# Patient Record
Sex: Female | Born: 1945 | Race: White | Hispanic: No | Marital: Married | Smoking: Never smoker
Health system: Southern US, Community
[De-identification: ages and names within clinical notes are randomized; demographics above are authoritative.]

## PROBLEM LIST (undated history)

## (undated) DIAGNOSIS — H348112 Central retinal vein occlusion, right eye, stable: Principal | ICD-10-CM

## (undated) HISTORY — DX: Central retinal vein occlusion, right eye, stable: H34.8112

---

## 2003-05-02 ENCOUNTER — Other Ambulatory Visit: Admission: RE | Admit: 2003-05-02 | Discharge: 2003-05-02 | Payer: Self-pay | Admitting: *Deleted

## 2010-05-28 ENCOUNTER — Emergency Department (HOSPITAL_COMMUNITY)
Admission: EM | Admit: 2010-05-28 | Discharge: 2010-05-28 | Disposition: A | Discharge status: Home / Self Care | Payer: Commercial Indemnity | Attending: Emergency Medicine | Admitting: Emergency Medicine

## 2010-05-28 ENCOUNTER — Emergency Department (HOSPITAL_COMMUNITY): Payer: Commercial Indemnity

## 2010-05-28 ENCOUNTER — Encounter (HOSPITAL_COMMUNITY): Payer: Self-pay

## 2010-05-28 DIAGNOSIS — W010XXA Fall on same level from slipping, tripping and stumbling without subsequent striking against object, initial encounter: Secondary | ICD-10-CM | POA: Insufficient documentation

## 2010-05-28 DIAGNOSIS — R51 Headache: Secondary | ICD-10-CM | POA: Insufficient documentation

## 2010-05-28 DIAGNOSIS — S0003XA Contusion of scalp, initial encounter: Secondary | ICD-10-CM | POA: Insufficient documentation

## 2011-06-03 ENCOUNTER — Ambulatory Visit (INDEPENDENT_AMBULATORY_CARE_PROVIDER_SITE_OTHER): Payer: BC Managed Care – PPO | Admitting: Obstetrics and Gynecology

## 2011-06-03 DIAGNOSIS — Z01419 Encounter for gynecological examination (general) (routine) without abnormal findings: Secondary | ICD-10-CM

## 2011-10-01 ENCOUNTER — Other Ambulatory Visit: Payer: Self-pay

## 2011-10-01 MED ORDER — ESTRADIOL ACETATE 0.05 MG/24HR VA RING: 1.0000 | VAGINAL_RING | VAGINAL | Status: DC

## 2011-10-01 NOTE — Telephone Encounter (Signed)
rx done ld

## 2011-10-24 IMAGING — CT CT HEAD W/O CM
2 series · 16 of 30 positions shown, 18 images · non-contrast
Comparison: None.

CLINICAL DATA: Trauma.  Fall.  Struck back of head.

CT HEAD WITHOUT CONTRAST
TECHNIQUE: Contiguous axial images were obtained from the base of
the skull through the vertex without contrast.

[Series 3: head w/o · axial · non-contrast · 0.55mm/px · z∈[+406,+526]mm · 8 of 32 slices shown, 10 images]
[im 4/32  brain]
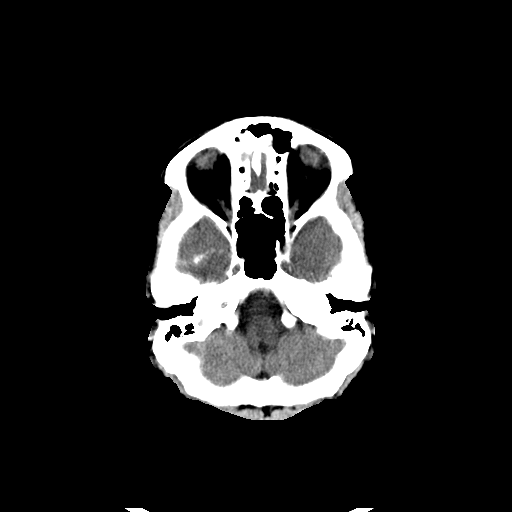
[im 4/32  bone]
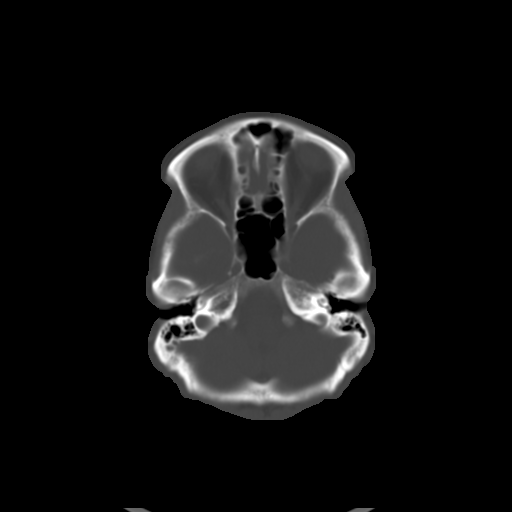
[im 7/32  brain]
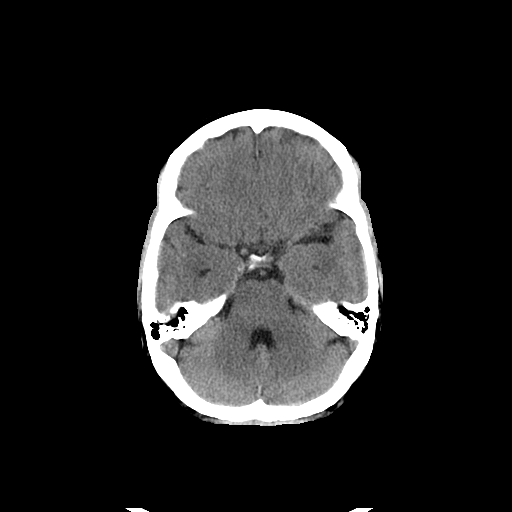
[im 11/32  brain]
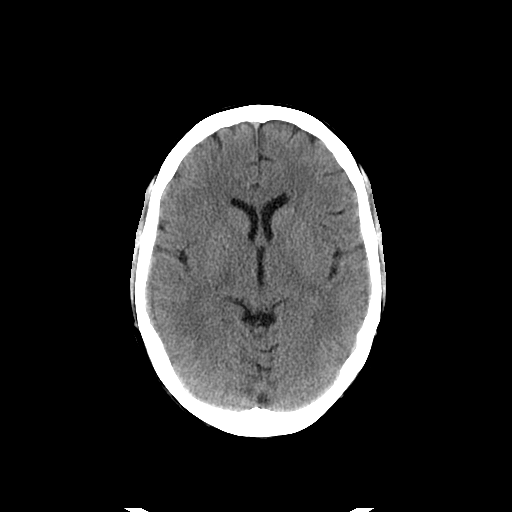
[im 14/32  brain]
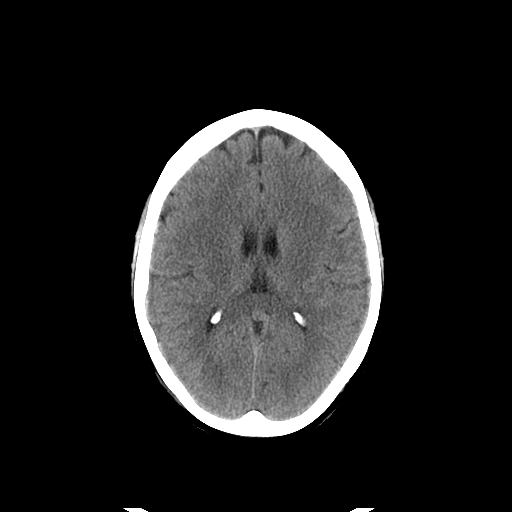
[im 18/32  brain]
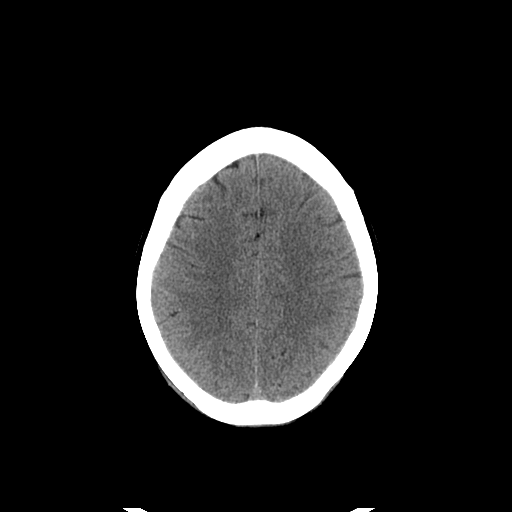
[im 18/32  bone]
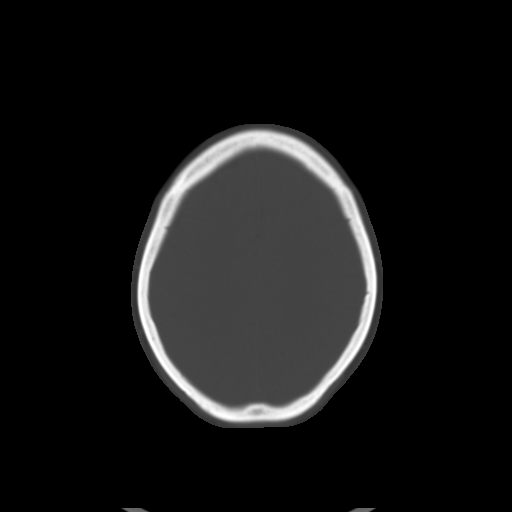
[im 21/32  brain]
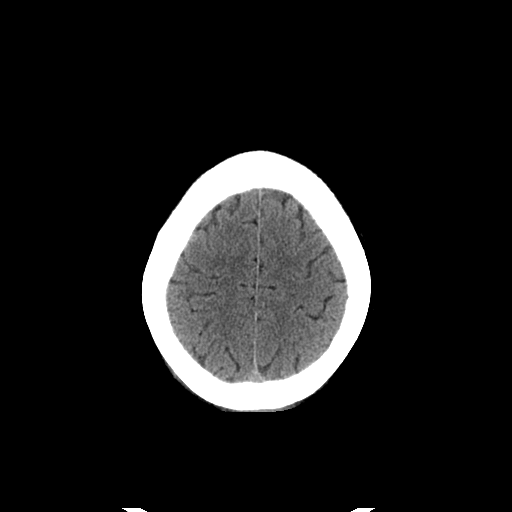
[im 25/32  brain]
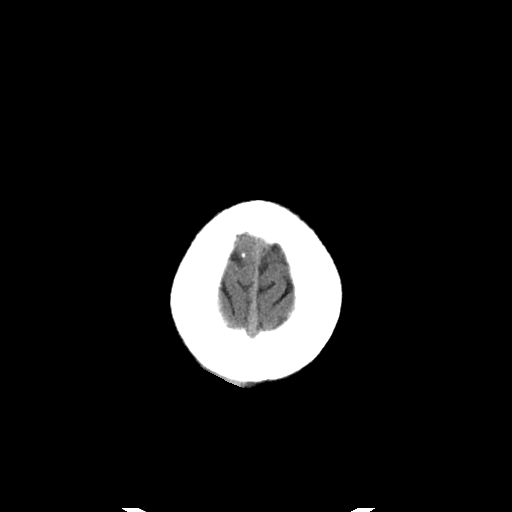
[im 28/32  brain]
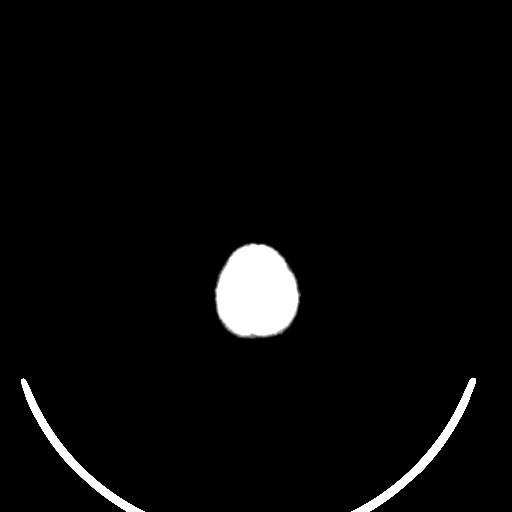

[Series 4: head w/o bone · axial · non-contrast · 0.55mm/px · z∈[+406,+529]mm · 8 of 63 slices shown]
[im 7/63  bone]
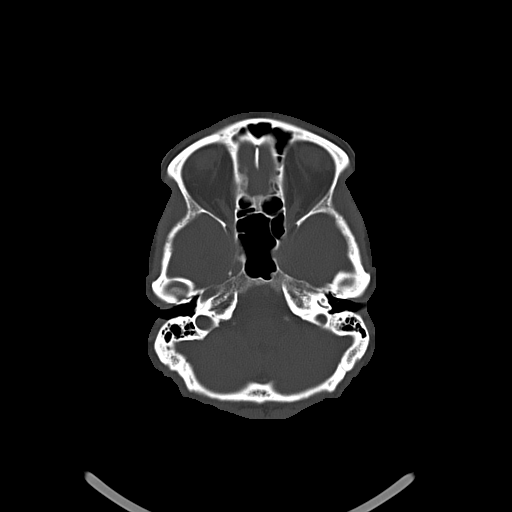
[im 14/63  bone]
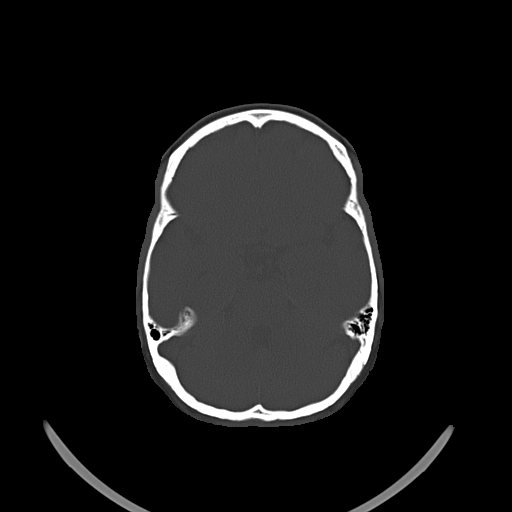
[im 20/63  bone]
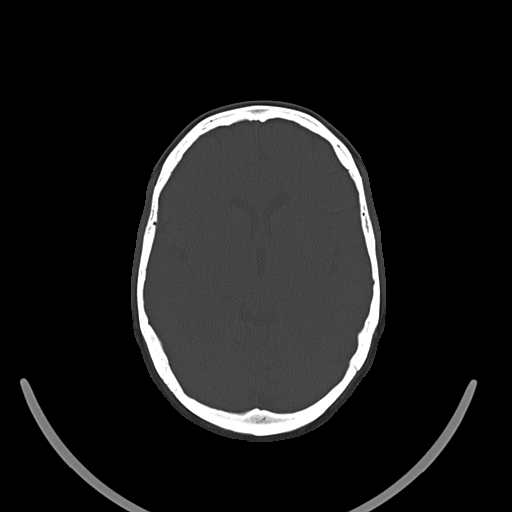
[im 27/63  bone]
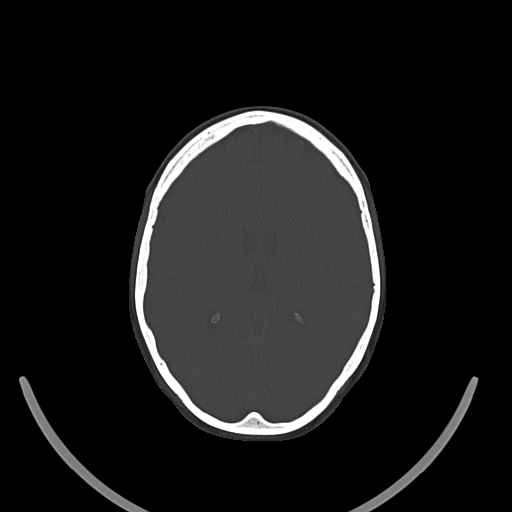
[im 36/63  bone]
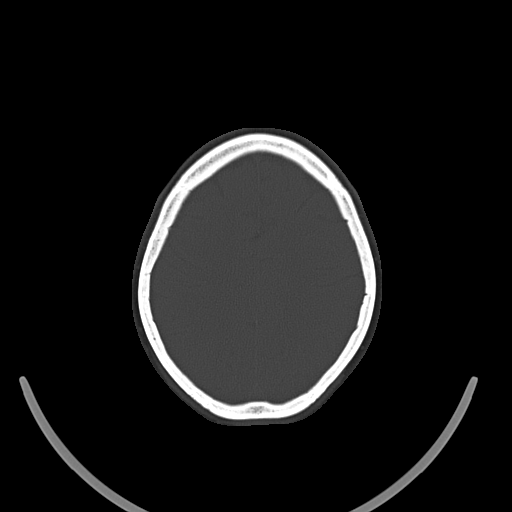
[im 43/63  bone]
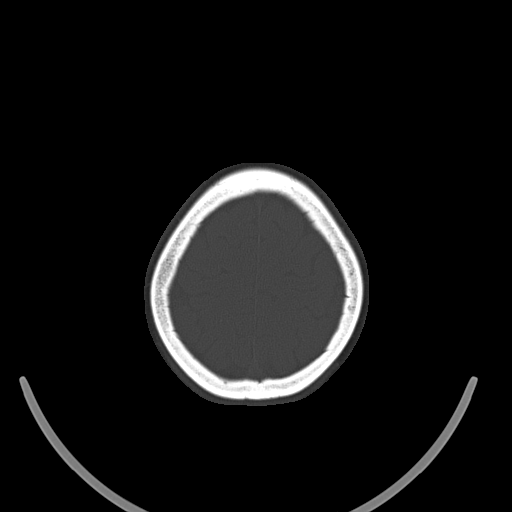
[im 49/63  bone]
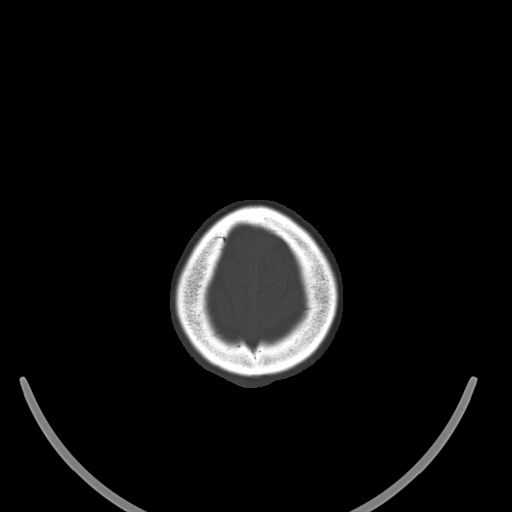
[im 56/63  bone]
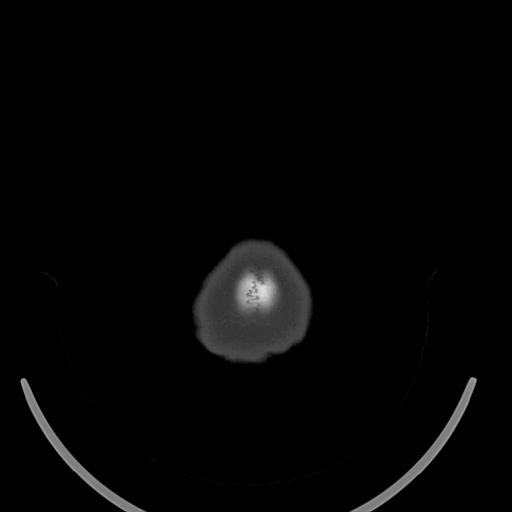

[16 of 30 positions shown; findings below may reference images not displayed]

FINDINGS: No mass lesion, mass effect, midline shift,
hydrocephalus, hemorrhage.  No territorial ischemia or acute
infarction.  Right parietal scalp hematoma is present.  No skull
fracture.  Paranasal sinuses normal.
IMPRESSION: Negative CT brain.  Small right parietal scalp hematoma.

## 2013-12-01 ENCOUNTER — Encounter (INDEPENDENT_AMBULATORY_CARE_PROVIDER_SITE_OTHER): Payer: Medicare HMO | Admitting: Ophthalmology

## 2013-12-01 DIAGNOSIS — H2510 Age-related nuclear cataract, unspecified eye: Secondary | ICD-10-CM

## 2013-12-01 DIAGNOSIS — H35039 Hypertensive retinopathy, unspecified eye: Secondary | ICD-10-CM

## 2013-12-01 DIAGNOSIS — H43819 Vitreous degeneration, unspecified eye: Secondary | ICD-10-CM

## 2013-12-01 DIAGNOSIS — H348392 Tributary (branch) retinal vein occlusion, unspecified eye, stable: Secondary | ICD-10-CM

## 2013-12-01 DIAGNOSIS — I10 Essential (primary) hypertension: Secondary | ICD-10-CM

## 2013-12-28 ENCOUNTER — Encounter (INDEPENDENT_AMBULATORY_CARE_PROVIDER_SITE_OTHER): Payer: Medicare HMO | Admitting: Ophthalmology

## 2013-12-28 DIAGNOSIS — H43819 Vitreous degeneration, unspecified eye: Secondary | ICD-10-CM

## 2013-12-28 DIAGNOSIS — H2510 Age-related nuclear cataract, unspecified eye: Secondary | ICD-10-CM

## 2013-12-28 DIAGNOSIS — H348392 Tributary (branch) retinal vein occlusion, unspecified eye, stable: Secondary | ICD-10-CM

## 2014-01-25 ENCOUNTER — Encounter (INDEPENDENT_AMBULATORY_CARE_PROVIDER_SITE_OTHER): Payer: Medicare HMO | Admitting: Ophthalmology

## 2014-01-25 DIAGNOSIS — H43813 Vitreous degeneration, bilateral: Secondary | ICD-10-CM

## 2014-01-25 DIAGNOSIS — H34831 Tributary (branch) retinal vein occlusion, right eye: Secondary | ICD-10-CM

## 2014-01-25 DIAGNOSIS — H2513 Age-related nuclear cataract, bilateral: Secondary | ICD-10-CM

## 2014-03-08 ENCOUNTER — Encounter (INDEPENDENT_AMBULATORY_CARE_PROVIDER_SITE_OTHER): Payer: Medicare HMO | Admitting: Ophthalmology

## 2014-03-08 DIAGNOSIS — H34831 Tributary (branch) retinal vein occlusion, right eye: Secondary | ICD-10-CM

## 2014-03-08 DIAGNOSIS — H43813 Vitreous degeneration, bilateral: Secondary | ICD-10-CM

## 2014-04-17 ENCOUNTER — Encounter (INDEPENDENT_AMBULATORY_CARE_PROVIDER_SITE_OTHER): Payer: PPO | Admitting: Ophthalmology

## 2014-04-17 DIAGNOSIS — H34831 Tributary (branch) retinal vein occlusion, right eye: Secondary | ICD-10-CM

## 2014-04-17 DIAGNOSIS — H2513 Age-related nuclear cataract, bilateral: Secondary | ICD-10-CM

## 2014-04-17 DIAGNOSIS — H43813 Vitreous degeneration, bilateral: Secondary | ICD-10-CM

## 2014-05-15 ENCOUNTER — Encounter (INDEPENDENT_AMBULATORY_CARE_PROVIDER_SITE_OTHER): Payer: PPO | Admitting: Ophthalmology

## 2014-05-15 DIAGNOSIS — H2513 Age-related nuclear cataract, bilateral: Secondary | ICD-10-CM

## 2014-05-15 DIAGNOSIS — H34831 Tributary (branch) retinal vein occlusion, right eye: Secondary | ICD-10-CM

## 2014-05-15 DIAGNOSIS — H43813 Vitreous degeneration, bilateral: Secondary | ICD-10-CM

## 2014-07-14 ENCOUNTER — Encounter (INDEPENDENT_AMBULATORY_CARE_PROVIDER_SITE_OTHER): Payer: PPO | Admitting: Ophthalmology

## 2014-07-14 DIAGNOSIS — H34831 Tributary (branch) retinal vein occlusion, right eye: Secondary | ICD-10-CM | POA: Diagnosis not present

## 2014-07-14 DIAGNOSIS — H43813 Vitreous degeneration, bilateral: Secondary | ICD-10-CM

## 2014-08-14 ENCOUNTER — Encounter (INDEPENDENT_AMBULATORY_CARE_PROVIDER_SITE_OTHER): Payer: PPO | Admitting: Ophthalmology

## 2014-08-18 ENCOUNTER — Encounter (INDEPENDENT_AMBULATORY_CARE_PROVIDER_SITE_OTHER): Payer: PPO | Admitting: Ophthalmology

## 2014-08-18 DIAGNOSIS — H34831 Tributary (branch) retinal vein occlusion, right eye: Secondary | ICD-10-CM | POA: Diagnosis not present

## 2014-08-18 DIAGNOSIS — H43813 Vitreous degeneration, bilateral: Secondary | ICD-10-CM | POA: Diagnosis not present

## 2014-09-25 ENCOUNTER — Encounter (INDEPENDENT_AMBULATORY_CARE_PROVIDER_SITE_OTHER): Payer: PPO | Admitting: Ophthalmology

## 2014-09-25 DIAGNOSIS — H34831 Tributary (branch) retinal vein occlusion, right eye: Secondary | ICD-10-CM | POA: Diagnosis not present

## 2014-09-25 DIAGNOSIS — H43813 Vitreous degeneration, bilateral: Secondary | ICD-10-CM | POA: Diagnosis not present

## 2014-09-25 DIAGNOSIS — H2513 Age-related nuclear cataract, bilateral: Secondary | ICD-10-CM | POA: Diagnosis not present

## 2014-11-20 ENCOUNTER — Encounter (INDEPENDENT_AMBULATORY_CARE_PROVIDER_SITE_OTHER): Payer: PPO | Admitting: Ophthalmology

## 2014-11-20 DIAGNOSIS — H2513 Age-related nuclear cataract, bilateral: Secondary | ICD-10-CM

## 2014-11-20 DIAGNOSIS — H34831 Tributary (branch) retinal vein occlusion, right eye: Secondary | ICD-10-CM | POA: Diagnosis not present

## 2014-11-20 DIAGNOSIS — H43813 Vitreous degeneration, bilateral: Secondary | ICD-10-CM | POA: Diagnosis not present

## 2015-01-29 ENCOUNTER — Encounter (INDEPENDENT_AMBULATORY_CARE_PROVIDER_SITE_OTHER): Payer: PPO | Admitting: Ophthalmology

## 2015-01-29 DIAGNOSIS — H348321 Tributary (branch) retinal vein occlusion, left eye, with retinal neovascularization: Secondary | ICD-10-CM

## 2015-01-29 DIAGNOSIS — H43813 Vitreous degeneration, bilateral: Secondary | ICD-10-CM

## 2015-01-29 DIAGNOSIS — H2513 Age-related nuclear cataract, bilateral: Secondary | ICD-10-CM

## 2015-04-25 DIAGNOSIS — M8580 Other specified disorders of bone density and structure, unspecified site: Secondary | ICD-10-CM | POA: Diagnosis not present

## 2015-04-25 DIAGNOSIS — Z79890 Hormone replacement therapy (postmenopausal): Secondary | ICD-10-CM | POA: Diagnosis not present

## 2015-04-25 DIAGNOSIS — Z01419 Encounter for gynecological examination (general) (routine) without abnormal findings: Secondary | ICD-10-CM | POA: Diagnosis not present

## 2015-04-25 DIAGNOSIS — Z6824 Body mass index (BMI) 24.0-24.9, adult: Secondary | ICD-10-CM | POA: Diagnosis not present

## 2015-04-30 ENCOUNTER — Encounter (INDEPENDENT_AMBULATORY_CARE_PROVIDER_SITE_OTHER): Payer: PPO | Admitting: Ophthalmology

## 2015-04-30 DIAGNOSIS — H34832 Tributary (branch) retinal vein occlusion, left eye, with macular edema: Secondary | ICD-10-CM | POA: Diagnosis not present

## 2015-04-30 DIAGNOSIS — H43813 Vitreous degeneration, bilateral: Secondary | ICD-10-CM | POA: Diagnosis not present

## 2015-05-15 DIAGNOSIS — M3500 Sicca syndrome, unspecified: Secondary | ICD-10-CM | POA: Diagnosis not present

## 2015-05-15 DIAGNOSIS — M2550 Pain in unspecified joint: Secondary | ICD-10-CM | POA: Diagnosis not present

## 2015-06-18 DIAGNOSIS — M3500 Sicca syndrome, unspecified: Secondary | ICD-10-CM | POA: Diagnosis not present

## 2015-06-18 DIAGNOSIS — Z23 Encounter for immunization: Secondary | ICD-10-CM | POA: Diagnosis not present

## 2015-06-18 DIAGNOSIS — E785 Hyperlipidemia, unspecified: Secondary | ICD-10-CM | POA: Diagnosis not present

## 2015-06-18 DIAGNOSIS — Z0000 Encounter for general adult medical examination without abnormal findings: Secondary | ICD-10-CM | POA: Diagnosis not present

## 2015-08-13 ENCOUNTER — Encounter (INDEPENDENT_AMBULATORY_CARE_PROVIDER_SITE_OTHER): Payer: PPO | Admitting: Ophthalmology

## 2015-08-13 DIAGNOSIS — H2513 Age-related nuclear cataract, bilateral: Secondary | ICD-10-CM

## 2015-08-13 DIAGNOSIS — H43813 Vitreous degeneration, bilateral: Secondary | ICD-10-CM | POA: Diagnosis not present

## 2015-08-13 DIAGNOSIS — H34831 Tributary (branch) retinal vein occlusion, right eye, with macular edema: Secondary | ICD-10-CM | POA: Diagnosis not present

## 2015-10-16 ENCOUNTER — Encounter (INDEPENDENT_AMBULATORY_CARE_PROVIDER_SITE_OTHER): Payer: PPO | Admitting: Ophthalmology

## 2015-10-16 DIAGNOSIS — H43813 Vitreous degeneration, bilateral: Secondary | ICD-10-CM | POA: Diagnosis not present

## 2015-10-16 DIAGNOSIS — H2513 Age-related nuclear cataract, bilateral: Secondary | ICD-10-CM

## 2015-10-16 DIAGNOSIS — H34831 Tributary (branch) retinal vein occlusion, right eye, with macular edema: Secondary | ICD-10-CM | POA: Diagnosis not present

## 2015-10-22 ENCOUNTER — Encounter (INDEPENDENT_AMBULATORY_CARE_PROVIDER_SITE_OTHER): Payer: Self-pay | Admitting: Ophthalmology

## 2015-12-12 ENCOUNTER — Encounter (INDEPENDENT_AMBULATORY_CARE_PROVIDER_SITE_OTHER): Payer: PPO | Admitting: Ophthalmology

## 2015-12-18 ENCOUNTER — Encounter (INDEPENDENT_AMBULATORY_CARE_PROVIDER_SITE_OTHER): Payer: PPO | Admitting: Ophthalmology

## 2015-12-18 DIAGNOSIS — H2513 Age-related nuclear cataract, bilateral: Secondary | ICD-10-CM

## 2015-12-18 DIAGNOSIS — H34831 Tributary (branch) retinal vein occlusion, right eye, with macular edema: Secondary | ICD-10-CM

## 2015-12-18 DIAGNOSIS — H43813 Vitreous degeneration, bilateral: Secondary | ICD-10-CM

## 2016-02-14 DIAGNOSIS — L821 Other seborrheic keratosis: Secondary | ICD-10-CM | POA: Diagnosis not present

## 2016-02-14 DIAGNOSIS — D1801 Hemangioma of skin and subcutaneous tissue: Secondary | ICD-10-CM | POA: Diagnosis not present

## 2016-02-14 DIAGNOSIS — D2371 Other benign neoplasm of skin of right lower limb, including hip: Secondary | ICD-10-CM | POA: Diagnosis not present

## 2016-02-14 DIAGNOSIS — L820 Inflamed seborrheic keratosis: Secondary | ICD-10-CM | POA: Diagnosis not present

## 2016-02-14 DIAGNOSIS — L814 Other melanin hyperpigmentation: Secondary | ICD-10-CM | POA: Diagnosis not present

## 2016-02-26 ENCOUNTER — Encounter (INDEPENDENT_AMBULATORY_CARE_PROVIDER_SITE_OTHER): Payer: PPO | Admitting: Ophthalmology

## 2016-02-26 DIAGNOSIS — H43813 Vitreous degeneration, bilateral: Secondary | ICD-10-CM | POA: Diagnosis not present

## 2016-02-26 DIAGNOSIS — H34831 Tributary (branch) retinal vein occlusion, right eye, with macular edema: Secondary | ICD-10-CM | POA: Diagnosis not present

## 2016-02-26 DIAGNOSIS — H2513 Age-related nuclear cataract, bilateral: Secondary | ICD-10-CM | POA: Diagnosis not present

## 2016-03-04 ENCOUNTER — Encounter (INDEPENDENT_AMBULATORY_CARE_PROVIDER_SITE_OTHER): Payer: Self-pay | Admitting: Ophthalmology

## 2016-05-28 ENCOUNTER — Encounter (INDEPENDENT_AMBULATORY_CARE_PROVIDER_SITE_OTHER): Payer: PPO | Admitting: Ophthalmology

## 2016-05-28 DIAGNOSIS — H34832 Tributary (branch) retinal vein occlusion, left eye, with macular edema: Secondary | ICD-10-CM

## 2016-05-28 DIAGNOSIS — H43813 Vitreous degeneration, bilateral: Secondary | ICD-10-CM | POA: Diagnosis not present

## 2016-08-27 ENCOUNTER — Encounter (INDEPENDENT_AMBULATORY_CARE_PROVIDER_SITE_OTHER): Payer: PPO | Admitting: Ophthalmology

## 2016-08-27 DIAGNOSIS — H43813 Vitreous degeneration, bilateral: Secondary | ICD-10-CM | POA: Diagnosis not present

## 2016-08-27 DIAGNOSIS — H2702 Aphakia, left eye: Secondary | ICD-10-CM | POA: Diagnosis not present

## 2016-08-27 DIAGNOSIS — H348312 Tributary (branch) retinal vein occlusion, right eye, stable: Secondary | ICD-10-CM

## 2016-08-27 DIAGNOSIS — H2511 Age-related nuclear cataract, right eye: Secondary | ICD-10-CM

## 2016-10-16 DIAGNOSIS — L820 Inflamed seborrheic keratosis: Secondary | ICD-10-CM | POA: Diagnosis not present

## 2016-10-16 DIAGNOSIS — L218 Other seborrheic dermatitis: Secondary | ICD-10-CM | POA: Diagnosis not present

## 2016-11-10 ENCOUNTER — Encounter (INDEPENDENT_AMBULATORY_CARE_PROVIDER_SITE_OTHER): Payer: PPO | Admitting: Ophthalmology

## 2016-11-10 DIAGNOSIS — H34831 Tributary (branch) retinal vein occlusion, right eye, with macular edema: Secondary | ICD-10-CM

## 2016-11-10 DIAGNOSIS — H2513 Age-related nuclear cataract, bilateral: Secondary | ICD-10-CM | POA: Diagnosis not present

## 2016-11-10 DIAGNOSIS — H43813 Vitreous degeneration, bilateral: Secondary | ICD-10-CM

## 2016-11-12 ENCOUNTER — Encounter (INDEPENDENT_AMBULATORY_CARE_PROVIDER_SITE_OTHER): Payer: Self-pay | Admitting: Ophthalmology

## 2016-11-18 DIAGNOSIS — M3500 Sicca syndrome, unspecified: Secondary | ICD-10-CM | POA: Diagnosis not present

## 2016-11-18 DIAGNOSIS — Z6823 Body mass index (BMI) 23.0-23.9, adult: Secondary | ICD-10-CM | POA: Diagnosis not present

## 2016-11-24 DIAGNOSIS — H524 Presbyopia: Secondary | ICD-10-CM | POA: Diagnosis not present

## 2016-11-24 DIAGNOSIS — H5203 Hypermetropia, bilateral: Secondary | ICD-10-CM | POA: Diagnosis not present

## 2017-01-27 ENCOUNTER — Encounter (INDEPENDENT_AMBULATORY_CARE_PROVIDER_SITE_OTHER): Payer: PPO | Admitting: Ophthalmology

## 2017-01-27 DIAGNOSIS — H34831 Tributary (branch) retinal vein occlusion, right eye, with macular edema: Secondary | ICD-10-CM | POA: Diagnosis not present

## 2017-01-27 DIAGNOSIS — H2513 Age-related nuclear cataract, bilateral: Secondary | ICD-10-CM

## 2017-01-27 DIAGNOSIS — H43813 Vitreous degeneration, bilateral: Secondary | ICD-10-CM | POA: Diagnosis not present

## 2017-02-03 DIAGNOSIS — Z1211 Encounter for screening for malignant neoplasm of colon: Secondary | ICD-10-CM | POA: Diagnosis not present

## 2017-04-15 ENCOUNTER — Encounter (INDEPENDENT_AMBULATORY_CARE_PROVIDER_SITE_OTHER): Payer: PPO | Admitting: Ophthalmology

## 2017-04-15 DIAGNOSIS — H2513 Age-related nuclear cataract, bilateral: Secondary | ICD-10-CM

## 2017-04-15 DIAGNOSIS — H34831 Tributary (branch) retinal vein occlusion, right eye, with macular edema: Secondary | ICD-10-CM | POA: Diagnosis not present

## 2017-05-05 DIAGNOSIS — Z13820 Encounter for screening for osteoporosis: Secondary | ICD-10-CM | POA: Diagnosis not present

## 2017-05-05 DIAGNOSIS — Z124 Encounter for screening for malignant neoplasm of cervix: Secondary | ICD-10-CM | POA: Diagnosis not present

## 2017-05-05 DIAGNOSIS — Z01419 Encounter for gynecological examination (general) (routine) without abnormal findings: Secondary | ICD-10-CM | POA: Diagnosis not present

## 2017-05-05 DIAGNOSIS — Z1211 Encounter for screening for malignant neoplasm of colon: Secondary | ICD-10-CM | POA: Diagnosis not present

## 2017-05-05 DIAGNOSIS — Z1231 Encounter for screening mammogram for malignant neoplasm of breast: Secondary | ICD-10-CM | POA: Diagnosis not present

## 2017-05-05 DIAGNOSIS — Z79890 Hormone replacement therapy (postmenopausal): Secondary | ICD-10-CM | POA: Diagnosis not present

## 2017-05-05 DIAGNOSIS — Z6825 Body mass index (BMI) 25.0-25.9, adult: Secondary | ICD-10-CM | POA: Diagnosis not present

## 2017-05-21 DIAGNOSIS — D2371 Other benign neoplasm of skin of right lower limb, including hip: Secondary | ICD-10-CM | POA: Diagnosis not present

## 2017-05-21 DIAGNOSIS — D225 Melanocytic nevi of trunk: Secondary | ICD-10-CM | POA: Diagnosis not present

## 2017-05-21 DIAGNOSIS — L918 Other hypertrophic disorders of the skin: Secondary | ICD-10-CM | POA: Diagnosis not present

## 2017-05-21 DIAGNOSIS — L821 Other seborrheic keratosis: Secondary | ICD-10-CM | POA: Diagnosis not present

## 2017-05-21 DIAGNOSIS — D1801 Hemangioma of skin and subcutaneous tissue: Secondary | ICD-10-CM | POA: Diagnosis not present

## 2017-05-21 DIAGNOSIS — D2261 Melanocytic nevi of right upper limb, including shoulder: Secondary | ICD-10-CM | POA: Diagnosis not present

## 2017-05-21 DIAGNOSIS — L814 Other melanin hyperpigmentation: Secondary | ICD-10-CM | POA: Diagnosis not present

## 2017-06-11 DIAGNOSIS — R928 Other abnormal and inconclusive findings on diagnostic imaging of breast: Secondary | ICD-10-CM | POA: Diagnosis not present

## 2017-06-29 ENCOUNTER — Encounter: Payer: Self-pay | Admitting: Oncology

## 2017-06-29 ENCOUNTER — Other Ambulatory Visit: Payer: Self-pay

## 2017-06-29 ENCOUNTER — Ambulatory Visit: Payer: PPO | Admitting: Oncology

## 2017-06-29 VITALS — BP 139/77 | HR 82 | Temp 98.1°F | Ht 64.0 in | Wt 140.6 lb

## 2017-06-29 DIAGNOSIS — Z90710 Acquired absence of both cervix and uterus: Secondary | ICD-10-CM

## 2017-06-29 DIAGNOSIS — N6019 Diffuse cystic mastopathy of unspecified breast: Secondary | ICD-10-CM | POA: Diagnosis not present

## 2017-06-29 DIAGNOSIS — Z8669 Personal history of other diseases of the nervous system and sense organs: Secondary | ICD-10-CM | POA: Diagnosis not present

## 2017-06-29 DIAGNOSIS — H349 Unspecified retinal vascular occlusion: Secondary | ICD-10-CM

## 2017-06-29 DIAGNOSIS — Z791 Long term (current) use of non-steroidal anti-inflammatories (NSAID): Secondary | ICD-10-CM

## 2017-06-29 DIAGNOSIS — Z90722 Acquired absence of ovaries, bilateral: Secondary | ICD-10-CM

## 2017-06-29 DIAGNOSIS — Z862 Personal history of diseases of the blood and blood-forming organs and certain disorders involving the immune mechanism: Secondary | ICD-10-CM | POA: Diagnosis not present

## 2017-06-29 DIAGNOSIS — Z881 Allergy status to other antibiotic agents status: Secondary | ICD-10-CM | POA: Diagnosis not present

## 2017-06-29 DIAGNOSIS — J4520 Mild intermittent asthma, uncomplicated: Secondary | ICD-10-CM | POA: Diagnosis not present

## 2017-06-29 DIAGNOSIS — Z79890 Hormone replacement therapy (postmenopausal): Secondary | ICD-10-CM | POA: Diagnosis not present

## 2017-06-29 DIAGNOSIS — H348112 Central retinal vein occlusion, right eye, stable: Secondary | ICD-10-CM

## 2017-06-29 HISTORY — DX: Central retinal vein occlusion, right eye, stable: H34.8112

## 2017-06-29 NOTE — Patient Instructions (Signed)
To lab today We will call with results Return visit as needed only

## 2017-06-29 NOTE — Progress Notes (Signed)
New Patient Hematology   Chloe Johnston [id number] [birth year] 72 y.o. 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  Further evaluation revealed a retinal vein occlusion.  She believes a number of laboratory studies were done at that time but nothing is recorded in the current epic record.  She has been followed very closely by Dr. Tempie Hoist.  She gets periodic injections of bevacizumab. She has never been pregnant.  She had a hysterectomy with oophorectomy many years ago and has been on estrogen containing hormone replacement for over 20 years and was on treatment at time of the retinal vein thrombosis which was felt to be the proximate reason for the same.  She offers that she was taking high-dose ibuprofen 5 or 6 pills at a time for knee pain at that time and wonders if there was any relationship since the package insert states that overuse could lead to heart attack or stroke. She has fibrocystic disease of the breast. She is currently on Femring slow release low-dose estrogen 0.05 mg per 24 hours. She gives a history of Sjogren's disease characterized by chronic dry eyes and dry mouth.  Never on any specific medication for this.  She uses frequent eyedrops. She has no signs or symptoms of inflammatory arthritis.  No systemic signs or symptoms of a collagen vascular disorder.  No suspicion for vasculitis but lab database not available. No neurologic symptoms. No other history of thrombotic events either personal or family.  Parents both lived until their 35s.  She has a brother age 73 and a brother age 83.  She is a never smoker.  She exercises 4 times a week. She may have mild asthma which got a lot better when she got rid of her cat.  Occasional  cough.  PMH: Past Medical History:  Diagnosis Date  . Asthma   . 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 Remote hysterectomy with oophorectomy.   Allergies: Allergies  Allergen Reactions  . Erythromycin     Medications:  Current Outpatient Medications:  .  Estradiol Acetate 0.05 MG/24HR RING, Place 1 each vaginally every 3 (three) months., Disp: 3 each, Rfl: 3  Social History: Married.  No children.  Never pregnant.  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  she has never smoked.   she drinks alcohol socially.  she does not use drugs.  Family History: See HPI  Review of Systems: See HPI. Remaining ROS negative.  Physical Exam: Blood pressure 139/77, pulse 82, temperature 98.1 F (36.7 C), temperature source Oral, height 5\' 4"  (1.626 m), weight 140 lb 9.6 oz (63.8 kg), SpO2 100 %. Wt Readings from Last 3 Encounters:  06/29/17 140 lb 9.6 oz (63.8 kg)     General appearance: Thin Caucasian woman looking younger than her stated age HENNT: Pharynx no erythema, exudate, mass, or ulcer. No thyromegaly or thyroid nodules Lymph nodes: No cervical, supraclavicular, or axillary lymphadenopathy Breasts:  Lungs: Clear to auscultation, resonant to percussion throughout Heart: Regular rhythm, no murmur, no gallop, no rub, no click, no edema Abdomen: Soft,  nontender, normal bowel sounds, no mass, no organomegaly Extremities: No edema, no calf tenderness Musculoskeletal: no joint deformities GU:  Vascular: Carotid pulses 2+, no bruits,  Neurologic: Alert, oriented, PERRLA, optic disc sharp on the right.  Increased cup to disc ratio.  Thinning of the blood vessels.  no hemorrhage or exudate, left this could not be visualized.  Cranial  nerves grossly normal, motor strength 5 over 5, reflexes 1+ symmetric, upper body coordination normal, gait normal, Skin: No rash or ecchymosis    Lab Results: No results found for: WBC, HGB, HCT, MCV, PLT   Chemistry   No results found for: NA, K, CL, CO2, BUN, CREATININE, GLU No results found for: CALCIUM, ALKPHOS, AST, ALT, BILITOT     Radiological Studies: No results found.    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  She has a vague history of Sjogren's disease but no systemic signs of vasculitis.  This was an isolated event with no previous or subsequent thrombotic events and there is no family history of thrombosis.  She does not appear to have any cardiovascular risk factors.  I do not have any ophthalmology records to know whether or not blood vessels in the contralateral eye are normal or have atherosclerotic changes that may have predisposed towards this event.  This event occurred when she was 71 years old.  Protein S, protein C, and Antithrombin deficiencies almost exclusively diagnosed before age 71.  She could have a prothrombin gene or factor V Leiden gene mutation.  Most people are heterozygotes.  If so, and if she was positive, I would not recommend anticoagulation at this point.  The only thing that could potentially change management would be if she had antiphospholipid antibodies or a positive lupus type anticoagulant.  This could predispose towards both venous and arterial events.  If positive, there is still considerable controversy over the best therapy for a limited venous event but I would tend to put her on a low-dose aspirin and not an anticoagulant.  I am going to go ahead and check a baseline CBC and an antiphospholipid antibody panel.  She has a number of risk factors for developing breast cancer.  She was never pregnant.  She has fibrocystic disease of the breast.  She was on prolonged  estrogen containing medication.  She is now 72 years old.  For all of these reasons, I would try to avoid estrogen. I think her post hysterectomy/postmenopausal symptoms could be controlled with a Mirena or Skyla IUD.  Topical therapy with Replens for vaginal dryness.  If the combination of these was insufficient, consider low-dose topical estrogen although I am not sure that systemic absorption would be any more or less than her current slow release estrogen ring.     Recommendation: See discussion above     Murriel Hopper, MD, [place]  Hematology-Oncology/Internal Medicine  06/29/2017, 6:02 PM

## 2017-06-30 LAB — CBC WITH DIFFERENTIAL/PLATELET
BASOS: 1 %
Basophils Absolute: 0.1 10*3/uL (ref 0.0–0.2)
EOS (ABSOLUTE): 0.2 10*3/uL (ref 0.0–0.4)
Eos: 2 %
Hematocrit: 37.1 % (ref 34.0–46.6)
Hemoglobin: 12.5 g/dL (ref 11.1–15.9)
Immature Grans (Abs): 0 10*3/uL (ref 0.0–0.1)
Immature Granulocytes: 0 %
Lymphocytes Absolute: 1.8 10*3/uL (ref 0.7–3.1)
Lymphs: 24 %
MCH: 31.9 pg (ref 26.6–33.0)
MCHC: 33.7 g/dL (ref 31.5–35.7)
MCV: 95 fL (ref 79–97)
MONOS ABS: 0.6 10*3/uL (ref 0.1–0.9)
Monocytes: 9 %
NEUTROS ABS: 4.7 10*3/uL (ref 1.4–7.0)
NEUTROS PCT: 64 %
PLATELETS: 329 10*3/uL (ref 150–379)
RBC: 3.92 x10E6/uL (ref 3.77–5.28)
RDW: 13.4 % (ref 12.3–15.4)
WBC: 7.3 10*3/uL (ref 3.4–10.8)

## 2017-06-30 LAB — BETA-2-GLYCOPROTEIN I ABS, IGG/M/A
Beta-2 Glyco 1 IgA: <9 GPI IgA units (ref 0–25)
Beta-2 Glyco 1 IgM: <9 GPI IgM units (ref 0–32)
Beta-2 Glyco I IgG: <9 GPI IgG units (ref 0–20)

## 2017-06-30 LAB — CARDIOLIPIN ANTIBODIES, IGG, IGM, IGA
Anticardiolipin IgA: <9 APL U/mL (ref 0–11)
Anticardiolipin IgG: <9 GPL U/mL (ref 0–14)

## 2017-06-30 LAB — LUPUS ANTICOAGULANT PANEL
DRVVT: 33.4 s (ref 0.0–47.0)
PTT LA: 37.8 s (ref 0.0–51.9)

## 2017-07-01 ENCOUNTER — Encounter (INDEPENDENT_AMBULATORY_CARE_PROVIDER_SITE_OTHER): Payer: PPO | Admitting: Ophthalmology

## 2017-07-01 DIAGNOSIS — H43813 Vitreous degeneration, bilateral: Secondary | ICD-10-CM | POA: Diagnosis not present

## 2017-07-01 DIAGNOSIS — H2513 Age-related nuclear cataract, bilateral: Secondary | ICD-10-CM

## 2017-07-01 DIAGNOSIS — H34831 Tributary (branch) retinal vein occlusion, right eye, with macular edema: Secondary | ICD-10-CM | POA: Diagnosis not present

## 2017-07-02 ENCOUNTER — Telehealth: Payer: Self-pay | Admitting: *Deleted

## 2017-07-02 NOTE — Telephone Encounter (Signed)
-----   Message from Annia Belt, MD sent at 07/01/2017  2:10 PM EDT ----- Call pt: normal blood count and no abnormal antibodies found against clotting factors

## 2017-07-02 NOTE — Telephone Encounter (Signed)
Called pt - no answer; left message on pt's cell phone "normal blood count and no abnormal antibodies found against clotting factors" per Dr Beryle Beams. And call for any questions.

## 2017-07-27 DIAGNOSIS — H00022 Hordeolum internum right lower eyelid: Secondary | ICD-10-CM | POA: Diagnosis not present

## 2017-07-27 DIAGNOSIS — H04123 Dry eye syndrome of bilateral lacrimal glands: Secondary | ICD-10-CM | POA: Diagnosis not present

## 2017-09-09 ENCOUNTER — Encounter (INDEPENDENT_AMBULATORY_CARE_PROVIDER_SITE_OTHER): Payer: PPO | Admitting: Ophthalmology

## 2017-09-09 DIAGNOSIS — H34831 Tributary (branch) retinal vein occlusion, right eye, with macular edema: Secondary | ICD-10-CM | POA: Diagnosis not present

## 2017-09-09 DIAGNOSIS — I10 Essential (primary) hypertension: Secondary | ICD-10-CM | POA: Diagnosis not present

## 2017-09-09 DIAGNOSIS — H43813 Vitreous degeneration, bilateral: Secondary | ICD-10-CM

## 2017-09-09 DIAGNOSIS — H35033 Hypertensive retinopathy, bilateral: Secondary | ICD-10-CM

## 2017-09-09 DIAGNOSIS — H2513 Age-related nuclear cataract, bilateral: Secondary | ICD-10-CM | POA: Diagnosis not present

## 2017-11-18 DIAGNOSIS — M3500 Sicca syndrome, unspecified: Secondary | ICD-10-CM | POA: Diagnosis not present

## 2017-11-18 DIAGNOSIS — Z6823 Body mass index (BMI) 23.0-23.9, adult: Secondary | ICD-10-CM | POA: Diagnosis not present

## 2017-11-25 ENCOUNTER — Encounter (INDEPENDENT_AMBULATORY_CARE_PROVIDER_SITE_OTHER): Payer: PPO | Admitting: Ophthalmology

## 2017-11-30 DIAGNOSIS — H5203 Hypermetropia, bilateral: Secondary | ICD-10-CM | POA: Diagnosis not present

## 2017-11-30 DIAGNOSIS — H524 Presbyopia: Secondary | ICD-10-CM | POA: Diagnosis not present

## 2017-11-30 DIAGNOSIS — H52223 Regular astigmatism, bilateral: Secondary | ICD-10-CM | POA: Diagnosis not present

## 2017-12-02 ENCOUNTER — Encounter (INDEPENDENT_AMBULATORY_CARE_PROVIDER_SITE_OTHER): Payer: PPO | Admitting: Ophthalmology

## 2017-12-02 DIAGNOSIS — H43813 Vitreous degeneration, bilateral: Secondary | ICD-10-CM

## 2017-12-02 DIAGNOSIS — H2513 Age-related nuclear cataract, bilateral: Secondary | ICD-10-CM

## 2017-12-02 DIAGNOSIS — H34831 Tributary (branch) retinal vein occlusion, right eye, with macular edema: Secondary | ICD-10-CM | POA: Diagnosis not present

## 2018-02-16 DIAGNOSIS — T485X5A Adverse effect of other anti-common-cold drugs, initial encounter: Secondary | ICD-10-CM | POA: Diagnosis not present

## 2018-02-16 DIAGNOSIS — J343 Hypertrophy of nasal turbinates: Secondary | ICD-10-CM | POA: Diagnosis not present

## 2018-02-16 DIAGNOSIS — J310 Chronic rhinitis: Secondary | ICD-10-CM | POA: Diagnosis not present

## 2018-03-10 ENCOUNTER — Encounter (INDEPENDENT_AMBULATORY_CARE_PROVIDER_SITE_OTHER): Payer: PPO | Admitting: Ophthalmology

## 2018-03-10 DIAGNOSIS — H2513 Age-related nuclear cataract, bilateral: Secondary | ICD-10-CM

## 2018-03-10 DIAGNOSIS — H34831 Tributary (branch) retinal vein occlusion, right eye, with macular edema: Secondary | ICD-10-CM | POA: Diagnosis not present

## 2018-03-10 DIAGNOSIS — H43813 Vitreous degeneration, bilateral: Secondary | ICD-10-CM

## 2018-03-16 DIAGNOSIS — T485X5A Adverse effect of other anti-common-cold drugs, initial encounter: Secondary | ICD-10-CM | POA: Diagnosis not present

## 2018-03-16 DIAGNOSIS — J310 Chronic rhinitis: Secondary | ICD-10-CM | POA: Diagnosis not present

## 2018-03-16 DIAGNOSIS — J343 Hypertrophy of nasal turbinates: Secondary | ICD-10-CM | POA: Diagnosis not present

## 2018-05-19 DIAGNOSIS — Z79890 Hormone replacement therapy (postmenopausal): Secondary | ICD-10-CM | POA: Diagnosis not present

## 2018-05-19 DIAGNOSIS — R928 Other abnormal and inconclusive findings on diagnostic imaging of breast: Secondary | ICD-10-CM | POA: Diagnosis not present

## 2018-05-19 DIAGNOSIS — Z6824 Body mass index (BMI) 24.0-24.9, adult: Secondary | ICD-10-CM | POA: Diagnosis not present

## 2018-05-19 DIAGNOSIS — Z1211 Encounter for screening for malignant neoplasm of colon: Secondary | ICD-10-CM | POA: Diagnosis not present

## 2018-05-19 DIAGNOSIS — Z01419 Encounter for gynecological examination (general) (routine) without abnormal findings: Secondary | ICD-10-CM | POA: Diagnosis not present

## 2018-05-19 DIAGNOSIS — Z09 Encounter for follow-up examination after completed treatment for conditions other than malignant neoplasm: Secondary | ICD-10-CM | POA: Diagnosis not present

## 2018-05-24 DIAGNOSIS — D485 Neoplasm of uncertain behavior of skin: Secondary | ICD-10-CM | POA: Diagnosis not present

## 2018-05-24 DIAGNOSIS — L218 Other seborrheic dermatitis: Secondary | ICD-10-CM | POA: Diagnosis not present

## 2018-05-24 DIAGNOSIS — L821 Other seborrheic keratosis: Secondary | ICD-10-CM | POA: Diagnosis not present

## 2018-05-24 DIAGNOSIS — L820 Inflamed seborrheic keratosis: Secondary | ICD-10-CM | POA: Diagnosis not present

## 2018-05-24 DIAGNOSIS — L239 Allergic contact dermatitis, unspecified cause: Secondary | ICD-10-CM | POA: Diagnosis not present

## 2018-06-16 ENCOUNTER — Encounter (INDEPENDENT_AMBULATORY_CARE_PROVIDER_SITE_OTHER): Payer: PPO | Admitting: Ophthalmology

## 2018-06-16 ENCOUNTER — Other Ambulatory Visit: Payer: Self-pay

## 2018-06-16 DIAGNOSIS — H43813 Vitreous degeneration, bilateral: Secondary | ICD-10-CM

## 2018-06-16 DIAGNOSIS — H34831 Tributary (branch) retinal vein occlusion, right eye, with macular edema: Secondary | ICD-10-CM

## 2018-07-08 DIAGNOSIS — J449 Chronic obstructive pulmonary disease, unspecified: Secondary | ICD-10-CM | POA: Diagnosis not present

## 2018-07-08 DIAGNOSIS — M5136 Other intervertebral disc degeneration, lumbar region: Secondary | ICD-10-CM | POA: Diagnosis not present

## 2018-07-08 DIAGNOSIS — G4752 REM sleep behavior disorder: Secondary | ICD-10-CM | POA: Diagnosis not present

## 2018-07-08 DIAGNOSIS — R519 Headache, unspecified: Secondary | ICD-10-CM | POA: Diagnosis not present

## 2018-07-08 DIAGNOSIS — F321 Major depressive disorder, single episode, moderate: Secondary | ICD-10-CM | POA: Diagnosis not present

## 2018-07-08 DIAGNOSIS — I5032 Chronic diastolic (congestive) heart failure: Secondary | ICD-10-CM | POA: Diagnosis not present

## 2018-07-08 DIAGNOSIS — G450 Vertebro-basilar artery syndrome: Secondary | ICD-10-CM | POA: Diagnosis not present

## 2018-07-08 DIAGNOSIS — L814 Other melanin hyperpigmentation: Secondary | ICD-10-CM | POA: Diagnosis not present

## 2018-07-08 DIAGNOSIS — R32 Unspecified urinary incontinence: Secondary | ICD-10-CM | POA: Diagnosis not present

## 2018-07-08 DIAGNOSIS — F418 Other specified anxiety disorders: Secondary | ICD-10-CM | POA: Diagnosis not present

## 2018-07-08 DIAGNOSIS — G2581 Restless legs syndrome: Secondary | ICD-10-CM | POA: Diagnosis not present

## 2018-07-08 DIAGNOSIS — G20 Parkinson's disease: Secondary | ICD-10-CM | POA: Diagnosis not present

## 2018-07-08 DIAGNOSIS — L300 Nummular dermatitis: Secondary | ICD-10-CM | POA: Diagnosis not present

## 2018-07-08 DIAGNOSIS — G47419 Narcolepsy without cataplexy: Secondary | ICD-10-CM | POA: Diagnosis not present

## 2018-07-08 DIAGNOSIS — G4733 Obstructive sleep apnea (adult) (pediatric): Secondary | ICD-10-CM | POA: Diagnosis not present

## 2018-07-08 DIAGNOSIS — E1142 Type 2 diabetes mellitus with diabetic polyneuropathy: Secondary | ICD-10-CM | POA: Diagnosis not present

## 2018-07-08 DIAGNOSIS — K219 Gastro-esophageal reflux disease without esophagitis: Secondary | ICD-10-CM | POA: Diagnosis not present

## 2018-07-08 DIAGNOSIS — J9610 Chronic respiratory failure, unspecified whether with hypoxia or hypercapnia: Secondary | ICD-10-CM | POA: Diagnosis not present

## 2018-07-08 DIAGNOSIS — L821 Other seborrheic keratosis: Secondary | ICD-10-CM | POA: Diagnosis not present

## 2018-07-08 DIAGNOSIS — L820 Inflamed seborrheic keratosis: Secondary | ICD-10-CM | POA: Diagnosis not present

## 2018-07-08 DIAGNOSIS — K5900 Constipation, unspecified: Secondary | ICD-10-CM | POA: Diagnosis not present

## 2018-07-08 DIAGNOSIS — I4510 Unspecified right bundle-branch block: Secondary | ICD-10-CM | POA: Diagnosis not present

## 2018-07-08 DIAGNOSIS — N400 Enlarged prostate without lower urinary tract symptoms: Secondary | ICD-10-CM | POA: Diagnosis not present

## 2018-07-08 DIAGNOSIS — D1801 Hemangioma of skin and subcutaneous tissue: Secondary | ICD-10-CM | POA: Diagnosis not present

## 2018-07-08 DIAGNOSIS — D2371 Other benign neoplasm of skin of right lower limb, including hip: Secondary | ICD-10-CM | POA: Diagnosis not present

## 2018-07-08 DIAGNOSIS — I959 Hypotension, unspecified: Secondary | ICD-10-CM | POA: Diagnosis not present

## 2018-07-08 DIAGNOSIS — I110 Hypertensive heart disease with heart failure: Secondary | ICD-10-CM | POA: Diagnosis not present

## 2018-07-08 DIAGNOSIS — L218 Other seborrheic dermatitis: Secondary | ICD-10-CM | POA: Diagnosis not present

## 2018-07-08 DIAGNOSIS — G894 Chronic pain syndrome: Secondary | ICD-10-CM | POA: Diagnosis not present

## 2018-07-08 DIAGNOSIS — M1990 Unspecified osteoarthritis, unspecified site: Secondary | ICD-10-CM | POA: Diagnosis not present

## 2018-07-08 DIAGNOSIS — D692 Other nonthrombocytopenic purpura: Secondary | ICD-10-CM | POA: Diagnosis not present

## 2018-07-08 DIAGNOSIS — F17210 Nicotine dependence, cigarettes, uncomplicated: Secondary | ICD-10-CM | POA: Diagnosis not present

## 2018-07-08 DIAGNOSIS — E785 Hyperlipidemia, unspecified: Secondary | ICD-10-CM | POA: Diagnosis not present

## 2018-07-08 DIAGNOSIS — D539 Nutritional anemia, unspecified: Secondary | ICD-10-CM | POA: Diagnosis not present

## 2018-10-06 ENCOUNTER — Encounter (INDEPENDENT_AMBULATORY_CARE_PROVIDER_SITE_OTHER): Payer: PPO | Admitting: Ophthalmology

## 2018-10-06 ENCOUNTER — Other Ambulatory Visit: Payer: Self-pay

## 2018-10-06 DIAGNOSIS — H34831 Tributary (branch) retinal vein occlusion, right eye, with macular edema: Secondary | ICD-10-CM

## 2018-10-06 DIAGNOSIS — H43813 Vitreous degeneration, bilateral: Secondary | ICD-10-CM

## 2018-10-06 DIAGNOSIS — H2513 Age-related nuclear cataract, bilateral: Secondary | ICD-10-CM | POA: Diagnosis not present

## 2018-11-03 DIAGNOSIS — H90A22 Sensorineural hearing loss, unilateral, left ear, with restricted hearing on the contralateral side: Secondary | ICD-10-CM | POA: Diagnosis not present

## 2018-11-03 DIAGNOSIS — H90A31 Mixed conductive and sensorineural hearing loss, unilateral, right ear with restricted hearing on the contralateral side: Secondary | ICD-10-CM | POA: Diagnosis not present

## 2018-11-09 DIAGNOSIS — J343 Hypertrophy of nasal turbinates: Secondary | ICD-10-CM | POA: Diagnosis not present

## 2018-11-09 DIAGNOSIS — J310 Chronic rhinitis: Secondary | ICD-10-CM | POA: Diagnosis not present

## 2018-11-09 DIAGNOSIS — J339 Nasal polyp, unspecified: Secondary | ICD-10-CM | POA: Diagnosis not present

## 2018-11-09 DIAGNOSIS — H6063 Unspecified chronic otitis externa, bilateral: Secondary | ICD-10-CM | POA: Diagnosis not present

## 2018-11-17 DIAGNOSIS — N6002 Solitary cyst of left breast: Secondary | ICD-10-CM | POA: Diagnosis not present

## 2018-11-18 DIAGNOSIS — M3500 Sicca syndrome, unspecified: Secondary | ICD-10-CM | POA: Diagnosis not present

## 2018-11-18 DIAGNOSIS — Z6823 Body mass index (BMI) 23.0-23.9, adult: Secondary | ICD-10-CM | POA: Diagnosis not present

## 2018-12-02 DIAGNOSIS — D485 Neoplasm of uncertain behavior of skin: Secondary | ICD-10-CM | POA: Diagnosis not present

## 2018-12-02 DIAGNOSIS — C44729 Squamous cell carcinoma of skin of left lower limb, including hip: Secondary | ICD-10-CM | POA: Diagnosis not present

## 2018-12-02 DIAGNOSIS — L218 Other seborrheic dermatitis: Secondary | ICD-10-CM | POA: Diagnosis not present

## 2018-12-20 DIAGNOSIS — J342 Deviated nasal septum: Secondary | ICD-10-CM | POA: Diagnosis not present

## 2018-12-20 DIAGNOSIS — J339 Nasal polyp, unspecified: Secondary | ICD-10-CM | POA: Diagnosis not present

## 2018-12-20 DIAGNOSIS — H6063 Unspecified chronic otitis externa, bilateral: Secondary | ICD-10-CM | POA: Diagnosis not present

## 2018-12-20 DIAGNOSIS — J343 Hypertrophy of nasal turbinates: Secondary | ICD-10-CM | POA: Diagnosis not present

## 2019-01-05 ENCOUNTER — Other Ambulatory Visit: Payer: Self-pay

## 2019-01-05 ENCOUNTER — Encounter (INDEPENDENT_AMBULATORY_CARE_PROVIDER_SITE_OTHER): Payer: PPO | Admitting: Ophthalmology

## 2019-01-05 DIAGNOSIS — H43813 Vitreous degeneration, bilateral: Secondary | ICD-10-CM

## 2019-01-05 DIAGNOSIS — H2513 Age-related nuclear cataract, bilateral: Secondary | ICD-10-CM

## 2019-01-05 DIAGNOSIS — H34831 Tributary (branch) retinal vein occlusion, right eye, with macular edema: Secondary | ICD-10-CM | POA: Diagnosis not present

## 2019-01-10 DIAGNOSIS — E785 Hyperlipidemia, unspecified: Secondary | ICD-10-CM | POA: Diagnosis not present

## 2019-01-10 DIAGNOSIS — Z0000 Encounter for general adult medical examination without abnormal findings: Secondary | ICD-10-CM | POA: Diagnosis not present

## 2019-01-10 DIAGNOSIS — Z23 Encounter for immunization: Secondary | ICD-10-CM | POA: Diagnosis not present

## 2019-01-10 DIAGNOSIS — M3500 Sicca syndrome, unspecified: Secondary | ICD-10-CM | POA: Diagnosis not present

## 2019-01-18 DIAGNOSIS — M3500 Sicca syndrome, unspecified: Secondary | ICD-10-CM | POA: Diagnosis not present

## 2019-01-18 DIAGNOSIS — E785 Hyperlipidemia, unspecified: Secondary | ICD-10-CM | POA: Diagnosis not present

## 2019-01-18 DIAGNOSIS — Z0000 Encounter for general adult medical examination without abnormal findings: Secondary | ICD-10-CM | POA: Diagnosis not present

## 2019-01-19 DIAGNOSIS — H6121 Impacted cerumen, right ear: Secondary | ICD-10-CM | POA: Diagnosis not present

## 2019-01-19 DIAGNOSIS — J342 Deviated nasal septum: Secondary | ICD-10-CM | POA: Diagnosis not present

## 2019-01-19 DIAGNOSIS — H6062 Unspecified chronic otitis externa, left ear: Secondary | ICD-10-CM | POA: Diagnosis not present

## 2019-01-19 DIAGNOSIS — Z7289 Other problems related to lifestyle: Secondary | ICD-10-CM | POA: Diagnosis not present

## 2019-02-14 DIAGNOSIS — J330 Polyp of nasal cavity: Secondary | ICD-10-CM | POA: Diagnosis not present

## 2019-02-14 DIAGNOSIS — Z7289 Other problems related to lifestyle: Secondary | ICD-10-CM | POA: Diagnosis not present

## 2019-02-14 DIAGNOSIS — R040 Epistaxis: Secondary | ICD-10-CM | POA: Diagnosis not present

## 2019-02-14 DIAGNOSIS — Z8669 Personal history of other diseases of the nervous system and sense organs: Secondary | ICD-10-CM | POA: Diagnosis not present

## 2019-02-14 DIAGNOSIS — H6123 Impacted cerumen, bilateral: Secondary | ICD-10-CM | POA: Diagnosis not present

## 2019-02-14 DIAGNOSIS — H6061 Unspecified chronic otitis externa, right ear: Secondary | ICD-10-CM | POA: Diagnosis not present

## 2019-02-14 DIAGNOSIS — J342 Deviated nasal septum: Secondary | ICD-10-CM | POA: Diagnosis not present

## 2019-03-09 DIAGNOSIS — J342 Deviated nasal septum: Secondary | ICD-10-CM | POA: Diagnosis not present

## 2019-03-09 DIAGNOSIS — H6063 Unspecified chronic otitis externa, bilateral: Secondary | ICD-10-CM | POA: Diagnosis not present

## 2019-03-09 DIAGNOSIS — J343 Hypertrophy of nasal turbinates: Secondary | ICD-10-CM | POA: Diagnosis not present

## 2019-03-09 DIAGNOSIS — H938X2 Other specified disorders of left ear: Secondary | ICD-10-CM | POA: Diagnosis not present

## 2019-03-09 DIAGNOSIS — H6121 Impacted cerumen, right ear: Secondary | ICD-10-CM | POA: Diagnosis not present

## 2019-03-09 DIAGNOSIS — Z7289 Other problems related to lifestyle: Secondary | ICD-10-CM | POA: Diagnosis not present

## 2019-03-22 DIAGNOSIS — E871 Hypo-osmolality and hyponatremia: Secondary | ICD-10-CM | POA: Diagnosis not present

## 2019-03-22 DIAGNOSIS — E7800 Pure hypercholesterolemia, unspecified: Secondary | ICD-10-CM | POA: Diagnosis not present

## 2019-04-13 DIAGNOSIS — H6123 Impacted cerumen, bilateral: Secondary | ICD-10-CM | POA: Diagnosis not present

## 2019-04-13 DIAGNOSIS — H6063 Unspecified chronic otitis externa, bilateral: Secondary | ICD-10-CM | POA: Diagnosis not present

## 2019-04-20 ENCOUNTER — Other Ambulatory Visit: Payer: Self-pay

## 2019-04-20 ENCOUNTER — Encounter (INDEPENDENT_AMBULATORY_CARE_PROVIDER_SITE_OTHER): Payer: PPO | Admitting: Ophthalmology

## 2019-04-20 DIAGNOSIS — H34831 Tributary (branch) retinal vein occlusion, right eye, with macular edema: Secondary | ICD-10-CM | POA: Diagnosis not present

## 2019-04-20 DIAGNOSIS — H43813 Vitreous degeneration, bilateral: Secondary | ICD-10-CM | POA: Diagnosis not present

## 2019-04-20 DIAGNOSIS — H2513 Age-related nuclear cataract, bilateral: Secondary | ICD-10-CM | POA: Diagnosis not present

## 2019-05-04 DIAGNOSIS — L218 Other seborrheic dermatitis: Secondary | ICD-10-CM | POA: Diagnosis not present

## 2019-05-04 DIAGNOSIS — L821 Other seborrheic keratosis: Secondary | ICD-10-CM | POA: Diagnosis not present

## 2019-05-04 DIAGNOSIS — Z85828 Personal history of other malignant neoplasm of skin: Secondary | ICD-10-CM | POA: Diagnosis not present

## 2019-05-04 DIAGNOSIS — L820 Inflamed seborrheic keratosis: Secondary | ICD-10-CM | POA: Diagnosis not present

## 2019-05-16 DIAGNOSIS — H6063 Unspecified chronic otitis externa, bilateral: Secondary | ICD-10-CM | POA: Diagnosis not present

## 2019-05-16 DIAGNOSIS — R040 Epistaxis: Secondary | ICD-10-CM | POA: Diagnosis not present

## 2019-05-24 DIAGNOSIS — N6002 Solitary cyst of left breast: Secondary | ICD-10-CM | POA: Diagnosis not present

## 2019-05-24 DIAGNOSIS — Z139 Encounter for screening, unspecified: Secondary | ICD-10-CM | POA: Diagnosis not present

## 2019-05-24 DIAGNOSIS — Z01419 Encounter for gynecological examination (general) (routine) without abnormal findings: Secondary | ICD-10-CM | POA: Diagnosis not present

## 2019-05-24 DIAGNOSIS — M810 Age-related osteoporosis without current pathological fracture: Secondary | ICD-10-CM | POA: Diagnosis not present

## 2019-05-24 DIAGNOSIS — Z1211 Encounter for screening for malignant neoplasm of colon: Secondary | ICD-10-CM | POA: Diagnosis not present

## 2019-05-24 DIAGNOSIS — Z6824 Body mass index (BMI) 24.0-24.9, adult: Secondary | ICD-10-CM | POA: Diagnosis not present

## 2019-06-28 DIAGNOSIS — B369 Superficial mycosis, unspecified: Secondary | ICD-10-CM | POA: Diagnosis not present

## 2019-06-28 DIAGNOSIS — H6123 Impacted cerumen, bilateral: Secondary | ICD-10-CM | POA: Diagnosis not present

## 2019-06-28 DIAGNOSIS — H6243 Otitis externa in other diseases classified elsewhere, bilateral: Secondary | ICD-10-CM | POA: Diagnosis not present

## 2019-07-01 DIAGNOSIS — L820 Inflamed seborrheic keratosis: Secondary | ICD-10-CM | POA: Diagnosis not present

## 2019-07-01 DIAGNOSIS — L218 Other seborrheic dermatitis: Secondary | ICD-10-CM | POA: Diagnosis not present

## 2019-07-01 DIAGNOSIS — Z85828 Personal history of other malignant neoplasm of skin: Secondary | ICD-10-CM | POA: Diagnosis not present

## 2019-07-20 ENCOUNTER — Encounter (INDEPENDENT_AMBULATORY_CARE_PROVIDER_SITE_OTHER): Payer: PPO | Admitting: Ophthalmology

## 2019-07-20 ENCOUNTER — Other Ambulatory Visit: Payer: Self-pay

## 2019-07-20 DIAGNOSIS — H43813 Vitreous degeneration, bilateral: Secondary | ICD-10-CM

## 2019-07-20 DIAGNOSIS — H34831 Tributary (branch) retinal vein occlusion, right eye, with macular edema: Secondary | ICD-10-CM | POA: Diagnosis not present

## 2019-08-09 DIAGNOSIS — H6121 Impacted cerumen, right ear: Secondary | ICD-10-CM | POA: Diagnosis not present

## 2019-08-09 DIAGNOSIS — H6123 Impacted cerumen, bilateral: Secondary | ICD-10-CM | POA: Diagnosis not present

## 2019-08-09 DIAGNOSIS — H6061 Unspecified chronic otitis externa, right ear: Secondary | ICD-10-CM | POA: Diagnosis not present

## 2019-08-09 DIAGNOSIS — H6062 Unspecified chronic otitis externa, left ear: Secondary | ICD-10-CM | POA: Diagnosis not present

## 2019-08-09 DIAGNOSIS — H6122 Impacted cerumen, left ear: Secondary | ICD-10-CM | POA: Diagnosis not present

## 2019-08-09 DIAGNOSIS — H6063 Unspecified chronic otitis externa, bilateral: Secondary | ICD-10-CM | POA: Diagnosis not present

## 2019-09-21 DIAGNOSIS — D1801 Hemangioma of skin and subcutaneous tissue: Secondary | ICD-10-CM | POA: Diagnosis not present

## 2019-09-21 DIAGNOSIS — L820 Inflamed seborrheic keratosis: Secondary | ICD-10-CM | POA: Diagnosis not present

## 2019-09-21 DIAGNOSIS — L814 Other melanin hyperpigmentation: Secondary | ICD-10-CM | POA: Diagnosis not present

## 2019-09-21 DIAGNOSIS — L720 Epidermal cyst: Secondary | ICD-10-CM | POA: Diagnosis not present

## 2019-09-21 DIAGNOSIS — L218 Other seborrheic dermatitis: Secondary | ICD-10-CM | POA: Diagnosis not present

## 2019-09-21 DIAGNOSIS — D2271 Melanocytic nevi of right lower limb, including hip: Secondary | ICD-10-CM | POA: Diagnosis not present

## 2019-09-21 DIAGNOSIS — Z85828 Personal history of other malignant neoplasm of skin: Secondary | ICD-10-CM | POA: Diagnosis not present

## 2019-09-21 DIAGNOSIS — L821 Other seborrheic keratosis: Secondary | ICD-10-CM | POA: Diagnosis not present

## 2019-10-17 DIAGNOSIS — B369 Superficial mycosis, unspecified: Secondary | ICD-10-CM | POA: Diagnosis not present

## 2019-10-17 DIAGNOSIS — H6123 Impacted cerumen, bilateral: Secondary | ICD-10-CM | POA: Diagnosis not present

## 2019-10-17 DIAGNOSIS — H6241 Otitis externa in other diseases classified elsewhere, right ear: Secondary | ICD-10-CM | POA: Diagnosis not present

## 2019-10-19 ENCOUNTER — Encounter (INDEPENDENT_AMBULATORY_CARE_PROVIDER_SITE_OTHER): Payer: PPO | Admitting: Ophthalmology

## 2019-10-19 ENCOUNTER — Other Ambulatory Visit: Payer: Self-pay

## 2019-10-19 DIAGNOSIS — H43813 Vitreous degeneration, bilateral: Secondary | ICD-10-CM

## 2019-10-19 DIAGNOSIS — H34831 Tributary (branch) retinal vein occlusion, right eye, with macular edema: Secondary | ICD-10-CM | POA: Diagnosis not present

## 2019-10-19 DIAGNOSIS — H2513 Age-related nuclear cataract, bilateral: Secondary | ICD-10-CM | POA: Diagnosis not present

## 2019-11-16 DIAGNOSIS — Z6822 Body mass index (BMI) 22.0-22.9, adult: Secondary | ICD-10-CM | POA: Diagnosis not present

## 2019-11-16 DIAGNOSIS — M3500 Sicca syndrome, unspecified: Secondary | ICD-10-CM | POA: Diagnosis not present

## 2019-11-18 DIAGNOSIS — H6063 Unspecified chronic otitis externa, bilateral: Secondary | ICD-10-CM | POA: Diagnosis not present

## 2019-11-22 DIAGNOSIS — Z85828 Personal history of other malignant neoplasm of skin: Secondary | ICD-10-CM | POA: Diagnosis not present

## 2019-11-22 DIAGNOSIS — L820 Inflamed seborrheic keratosis: Secondary | ICD-10-CM | POA: Diagnosis not present

## 2019-11-22 DIAGNOSIS — L723 Sebaceous cyst: Secondary | ICD-10-CM | POA: Diagnosis not present

## 2019-12-20 DIAGNOSIS — H52223 Regular astigmatism, bilateral: Secondary | ICD-10-CM | POA: Diagnosis not present

## 2019-12-20 DIAGNOSIS — H04123 Dry eye syndrome of bilateral lacrimal glands: Secondary | ICD-10-CM | POA: Diagnosis not present

## 2019-12-20 DIAGNOSIS — H5203 Hypermetropia, bilateral: Secondary | ICD-10-CM | POA: Diagnosis not present

## 2019-12-20 DIAGNOSIS — H524 Presbyopia: Secondary | ICD-10-CM | POA: Diagnosis not present

## 2019-12-20 DIAGNOSIS — H348312 Tributary (branch) retinal vein occlusion, right eye, stable: Secondary | ICD-10-CM | POA: Diagnosis not present

## 2019-12-21 DIAGNOSIS — D2339 Other benign neoplasm of skin of other parts of face: Secondary | ICD-10-CM | POA: Diagnosis not present

## 2019-12-21 DIAGNOSIS — Z85828 Personal history of other malignant neoplasm of skin: Secondary | ICD-10-CM | POA: Diagnosis not present

## 2020-01-09 ENCOUNTER — Encounter (INDEPENDENT_AMBULATORY_CARE_PROVIDER_SITE_OTHER): Payer: PPO | Admitting: Ophthalmology

## 2020-01-09 ENCOUNTER — Other Ambulatory Visit: Payer: Self-pay

## 2020-01-09 DIAGNOSIS — H34831 Tributary (branch) retinal vein occlusion, right eye, with macular edema: Secondary | ICD-10-CM | POA: Diagnosis not present

## 2020-01-09 DIAGNOSIS — H2513 Age-related nuclear cataract, bilateral: Secondary | ICD-10-CM | POA: Diagnosis not present

## 2020-01-09 DIAGNOSIS — H43813 Vitreous degeneration, bilateral: Secondary | ICD-10-CM | POA: Diagnosis not present

## 2020-01-30 DIAGNOSIS — H6122 Impacted cerumen, left ear: Secondary | ICD-10-CM | POA: Diagnosis not present

## 2020-01-30 DIAGNOSIS — H6121 Impacted cerumen, right ear: Secondary | ICD-10-CM | POA: Diagnosis not present

## 2020-01-30 DIAGNOSIS — H938X3 Other specified disorders of ear, bilateral: Secondary | ICD-10-CM | POA: Diagnosis not present

## 2020-01-30 DIAGNOSIS — H6063 Unspecified chronic otitis externa, bilateral: Secondary | ICD-10-CM | POA: Diagnosis not present

## 2020-01-30 DIAGNOSIS — H6123 Impacted cerumen, bilateral: Secondary | ICD-10-CM | POA: Diagnosis not present

## 2020-02-06 DIAGNOSIS — R030 Elevated blood-pressure reading, without diagnosis of hypertension: Secondary | ICD-10-CM | POA: Diagnosis not present

## 2020-02-06 DIAGNOSIS — E7800 Pure hypercholesterolemia, unspecified: Secondary | ICD-10-CM | POA: Diagnosis not present

## 2020-02-06 DIAGNOSIS — Z0000 Encounter for general adult medical examination without abnormal findings: Secondary | ICD-10-CM | POA: Diagnosis not present

## 2020-02-06 DIAGNOSIS — Z23 Encounter for immunization: Secondary | ICD-10-CM | POA: Diagnosis not present

## 2020-02-06 DIAGNOSIS — M3500 Sicca syndrome, unspecified: Secondary | ICD-10-CM | POA: Diagnosis not present

## 2020-02-27 DIAGNOSIS — Z85828 Personal history of other malignant neoplasm of skin: Secondary | ICD-10-CM | POA: Diagnosis not present

## 2020-02-27 DIAGNOSIS — L110 Acquired keratosis follicularis: Secondary | ICD-10-CM | POA: Diagnosis not present

## 2020-02-27 DIAGNOSIS — D485 Neoplasm of uncertain behavior of skin: Secondary | ICD-10-CM | POA: Diagnosis not present

## 2020-03-12 DIAGNOSIS — H6123 Impacted cerumen, bilateral: Secondary | ICD-10-CM | POA: Diagnosis not present

## 2020-03-12 DIAGNOSIS — H6063 Unspecified chronic otitis externa, bilateral: Secondary | ICD-10-CM | POA: Diagnosis not present

## 2020-04-09 ENCOUNTER — Other Ambulatory Visit: Payer: Self-pay

## 2020-04-09 ENCOUNTER — Encounter (INDEPENDENT_AMBULATORY_CARE_PROVIDER_SITE_OTHER): Payer: PPO | Admitting: Ophthalmology

## 2020-04-09 DIAGNOSIS — H34831 Tributary (branch) retinal vein occlusion, right eye, with macular edema: Secondary | ICD-10-CM | POA: Diagnosis not present

## 2020-04-09 DIAGNOSIS — H43813 Vitreous degeneration, bilateral: Secondary | ICD-10-CM | POA: Diagnosis not present

## 2020-05-23 DIAGNOSIS — Z1239 Encounter for other screening for malignant neoplasm of breast: Secondary | ICD-10-CM | POA: Diagnosis not present

## 2020-05-23 DIAGNOSIS — I10 Essential (primary) hypertension: Secondary | ICD-10-CM | POA: Diagnosis not present

## 2020-05-23 DIAGNOSIS — Z01419 Encounter for gynecological examination (general) (routine) without abnormal findings: Secondary | ICD-10-CM | POA: Diagnosis not present

## 2020-05-23 DIAGNOSIS — E7800 Pure hypercholesterolemia, unspecified: Secondary | ICD-10-CM | POA: Diagnosis not present

## 2020-05-23 DIAGNOSIS — M8580 Other specified disorders of bone density and structure, unspecified site: Secondary | ICD-10-CM | POA: Diagnosis not present

## 2020-05-23 DIAGNOSIS — Z1211 Encounter for screening for malignant neoplasm of colon: Secondary | ICD-10-CM | POA: Diagnosis not present

## 2020-05-23 DIAGNOSIS — Z1231 Encounter for screening mammogram for malignant neoplasm of breast: Secondary | ICD-10-CM | POA: Diagnosis not present

## 2020-06-06 ENCOUNTER — Other Ambulatory Visit: Payer: Self-pay

## 2020-06-06 ENCOUNTER — Ambulatory Visit: Payer: PPO | Admitting: Internal Medicine

## 2020-06-06 ENCOUNTER — Encounter: Payer: Self-pay | Admitting: Internal Medicine

## 2020-06-06 VITALS — BP 154/98 | HR 88 | Ht 64.0 in | Wt 133.4 lb

## 2020-06-06 DIAGNOSIS — E7800 Pure hypercholesterolemia, unspecified: Secondary | ICD-10-CM | POA: Diagnosis not present

## 2020-06-06 NOTE — Progress Notes (Signed)
LIPID CLINIC CONSULT NOTE  Chief Complaint:  Manage dyslipidemia  Primary Care Physician: Chloe Nip, MD  Primary Cardiologist:  No primary care provider on file.  HPI:  Chloe Johnston is a 75 y.o. female who is being seen today for the evaluation of dyslipidemia at the request of Chloe Johnston, Chloe Salinas, MD.  This is a pleasant 75 year old female kindly referred for evaluation management of dyslipidemia.  She reports a longstanding dyslipidemia with a significantly elevated cholesterol profile.  Total most recently was 323, HDL 93, triglycerides 70 and LDL 220.  She has no known coronary disease.  She feels that she is fairly healthy, exercising regularly, eating healthy and avoiding bad habits.  She denies any early onset heart disease in the family.  She has been reluctant in the past to take any medications including statins.  She reports a fairly healthy diet low in saturated fats.  PMHx:  Past Medical History:  Diagnosis Date  . Asthma   . 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  FAMHx:  No family history on file.  No early onset coronary disease in family.  SOCHx:   reports that she has never smoked. She has never used smokeless tobacco. She reports current alcohol use. She reports that she does not use drugs.  ALLERGIES:  Allergies  Allergen Reactions  . Erythromycin     ROS: Pertinent items noted in HPI and remainder of comprehensive ROS otherwise negative.  HOME MEDS: Current Outpatient Medications on File Prior to Visit  Medication Sig Dispense Refill  . Besifloxacin HCl (BESIVANCE) 0.6 % SUSP Place 1 drop into the right eye as needed.    . Calcium Carbonate+Vitamin D (CALCIUM 600+D3) 600-200 MG-UNIT TABS Take by mouth daily.    . cevimeline (EVOXAC) 30 MG capsule Take 30 mg by mouth 2 (two) times daily.    . Cholecalciferol 1.25 MG (50000 UT)  capsule Take 50,000 Units by mouth.    . clobetasol (TEMOVATE) 0.05 % external solution Apply topically as directed.    . docusate sodium (STOOL SOFTENER) 100 MG capsule Take 100 mg by mouth daily.    . hydrocortisone 2.5 % cream Apply topically as needed.     No current facility-administered medications on file prior to visit.    LABS/IMAGING: No results found for this or any previous visit (from the past 48 hour(s)). No results found.  LIPID PANEL: No results found for: CHOL, TRIG, HDL, CHOLHDL, VLDL, LDLCALC, LDLDIRECT  WEIGHTS: Wt Readings from Last 3 Encounters:  06/06/20 133 lb 6.4 oz (60.5 kg)  06/29/17 140 lb 9.6 oz (63.8 kg)    VITALS: Ht 5\' 4"  (1.626 m)   Wt 133 lb 6.4 oz (60.5 kg)   BMI 22.90 kg/m   EXAM: General appearance: alert, no distress and thin Neck: no carotid bruit, no JVD and thyroid not enlarged, symmetric, no tenderness/mass/nodules Lungs: clear to auscultation bilaterally Heart: regular rate and rhythm, S1, S2 normal, no murmur, click, rub or gallop Abdomen: soft, non-tender; bowel sounds normal; no masses,  no organomegaly Extremities: extremities normal, atraumatic, no cyanosis or edema Pulses: 2+ and symmetric Skin: Skin color, texture, turgor normal. No rashes or lesions Neurologic: Grossly normal Psych: Pleasant  EKG: Deferred  ASSESSMENT: 1. Dyslipidemia with LDL greater than 190, possible FH  PLAN: 1.   [name] has a dyslipidemia with LDL greater  than 190, possibly a familial hyperlipidemia.  Her diet is not significant for saturated fats and she does not eat a ketogenic diet.  She has previously declined statin therapy.  She does note a longstanding history of high cholesterol.  I suspect this is a familial hyperlipidemia, but may not be pathogenic.  She has a very high HDL cholesterol which could be cardioprotective although may not be.  This is difficult to sort out without functional testing of the APO a lipoprotein's on HDL.  1  option would be considering coronary calcium scoring.  If she has 0 to low amounts of coronary calcium then her HDL might be cardioprotective.  Otherwise if there is some evidence of advanced coronary disease, then therapy would be recommended.  Current guidelines recommend 50% reduction in LDL with a high potency statin as previously noted by her PCP.  As mentioned she has been reluctant to take this.  She will consider calcium scoring and contact me if she wishes to proceed further.  Thanks for the kind referral.  Chloe Casino, MD, FACC, [place] Director of the [facility]  [facility] Diplomate of the American Board of Clinical Lipidology Attending Cardiologist  Direct Dial: [phone]  Fax: [phone]  Website:  [email or web address] Chloe Johnston 06/06/2020, 1:48 PM

## 2020-06-06 NOTE — Patient Instructions (Signed)
Medication Instructions:  Your physician recommends that you continue on your current medications as directed. Please refer to the Current Medication list given to you today.  *If you need a refill on your cardiac medications before your next appointment, please call your pharmacy*  Testing/Procedures: Dr. Debara Pickett has suggested a CT coronary calcium score. This test is done at [address]. This is $99 out of pocket. Please contact our office if you wish for [place] to order this test.    Coronary CalciumScan A coronary calcium scan is an imaging test used to look for deposits of calcium and other fatty materials (plaques) in the inner lining of the blood vessels of the heart (coronary arteries). These deposits of calcium and plaques can partly clog and narrow the coronary arteries without producing any symptoms or warning signs. This puts a person at risk for a heart attack. This test can detect these deposits before symptoms develop. Tell a health care provider about:  Any allergies you have.  All medicines you are taking, including vitamins, herbs, eye drops, creams, and over-the-counter medicines.  Any problems you or family members have had with anesthetic medicines.  Any blood disorders you have.  Any surgeries you have had.  Any medical conditions you have.  Whether you are pregnant or may be pregnant. What are the risks? Generally, this is a safe procedure. However, problems may occur, including:  Harm to a pregnant woman and her unborn baby. This test involves the use of radiation. Radiation exposure can be dangerous to a pregnant woman and her unborn baby. If you are pregnant, you generally should not have this procedure done.  Slight increase in the risk of cancer. This is because of the radiation involved in the test. What happens before the procedure? No preparation is needed for this procedure. What happens during the procedure?  You will undress and  remove any jewelry around your neck or chest.  You will put on a hospital gown.  Sticky electrodes will be placed on your chest. The electrodes will be connected to an electrocardiogram (ECG) machine to record a tracing of the electrical activity of your heart.  A CT scanner will take pictures of your heart. During this time, you will be asked to lie still and hold your breath for 2-3 seconds while a picture of your heart is being taken. The procedure may vary among health care providers and hospitals. What happens after the procedure?  You can get dressed.  You can return to your normal activities.  It is up to you to get the results of your test. Ask your health care provider, or the department that is doing the test, when your results will be ready. Summary  A coronary calcium scan is an imaging test used to look for deposits of calcium and other fatty materials (plaques) in the inner lining of the blood vessels of the heart (coronary arteries).  Generally, this is a safe procedure. Tell your health care provider if you are pregnant or may be pregnant.  No preparation is needed for this procedure.  A CT scanner will take pictures of your heart.  You can return to your normal activities after the scan is done. This information is not intended to replace advice given to you by your health care provider. Make sure you discuss any questions you have with your health care provider. Document Released: 09/20/2007 Document Revised: 02/11/2016 Document Reviewed: 02/11/2016 Elsevier Interactive Patient Education  2017 [employer].  Follow-Up: At [facility], you and your health needs are our priority.  As part of our continuing mission to provide you with exceptional heart care, we have created designated Provider Care Teams.  These Care Teams include your primary Cardiologist (physician) and Advanced Practice Providers (APPs -  Physician Assistants and Nurse Practitioners) who all  work together to provide you with the care you need, when you need it.  We recommend signing up for the patient portal called "MyChart".  Sign up information is provided on this After Visit Summary.  MyChart is used to connect with patients for Virtual Visits (Telemedicine).  Patients are able to view lab/test results, encounter notes, upcoming appointments, etc.  Non-urgent messages can be sent to your provider as well.   To learn more about what you can do with MyChart, go to [email or web address].    Your next appointment:   AS NEEDED with Dr. Debara Pickett for lipid management

## 2020-06-25 ENCOUNTER — Encounter (INDEPENDENT_AMBULATORY_CARE_PROVIDER_SITE_OTHER): Payer: PPO | Admitting: Ophthalmology

## 2020-06-27 ENCOUNTER — Other Ambulatory Visit: Payer: Self-pay

## 2020-06-27 ENCOUNTER — Encounter (INDEPENDENT_AMBULATORY_CARE_PROVIDER_SITE_OTHER): Payer: PPO | Admitting: Ophthalmology

## 2020-06-27 DIAGNOSIS — H34831 Tributary (branch) retinal vein occlusion, right eye, with macular edema: Secondary | ICD-10-CM

## 2020-06-27 DIAGNOSIS — H2513 Age-related nuclear cataract, bilateral: Secondary | ICD-10-CM | POA: Diagnosis not present

## 2020-06-27 DIAGNOSIS — H43813 Vitreous degeneration, bilateral: Secondary | ICD-10-CM

## 2020-07-03 DIAGNOSIS — H6063 Unspecified chronic otitis externa, bilateral: Secondary | ICD-10-CM | POA: Diagnosis not present

## 2020-07-30 DIAGNOSIS — E559 Vitamin D deficiency, unspecified: Secondary | ICD-10-CM | POA: Diagnosis not present

## 2020-09-12 ENCOUNTER — Encounter (INDEPENDENT_AMBULATORY_CARE_PROVIDER_SITE_OTHER): Payer: PPO | Admitting: Ophthalmology

## 2020-09-12 ENCOUNTER — Other Ambulatory Visit: Payer: Self-pay

## 2020-09-12 DIAGNOSIS — H34831 Tributary (branch) retinal vein occlusion, right eye, with macular edema: Secondary | ICD-10-CM

## 2020-09-12 DIAGNOSIS — H2513 Age-related nuclear cataract, bilateral: Secondary | ICD-10-CM | POA: Diagnosis not present

## 2020-09-12 DIAGNOSIS — H43813 Vitreous degeneration, bilateral: Secondary | ICD-10-CM

## 2020-11-21 DIAGNOSIS — Z6822 Body mass index (BMI) 22.0-22.9, adult: Secondary | ICD-10-CM | POA: Diagnosis not present

## 2020-11-21 DIAGNOSIS — M3500 Sicca syndrome, unspecified: Secondary | ICD-10-CM | POA: Diagnosis not present

## 2020-11-21 DIAGNOSIS — M7989 Other specified soft tissue disorders: Secondary | ICD-10-CM | POA: Diagnosis not present

## 2020-11-27 ENCOUNTER — Other Ambulatory Visit: Payer: Self-pay

## 2020-11-27 ENCOUNTER — Encounter (INDEPENDENT_AMBULATORY_CARE_PROVIDER_SITE_OTHER): Payer: PPO | Admitting: Ophthalmology

## 2020-11-27 DIAGNOSIS — H43813 Vitreous degeneration, bilateral: Secondary | ICD-10-CM

## 2020-11-27 DIAGNOSIS — H34831 Tributary (branch) retinal vein occlusion, right eye, with macular edema: Secondary | ICD-10-CM | POA: Diagnosis not present

## 2020-11-27 DIAGNOSIS — H35031 Hypertensive retinopathy, right eye: Secondary | ICD-10-CM | POA: Diagnosis not present

## 2020-11-27 DIAGNOSIS — I10 Essential (primary) hypertension: Secondary | ICD-10-CM | POA: Diagnosis not present

## 2020-11-27 DIAGNOSIS — H2513 Age-related nuclear cataract, bilateral: Secondary | ICD-10-CM | POA: Diagnosis not present

## 2020-11-28 ENCOUNTER — Encounter (INDEPENDENT_AMBULATORY_CARE_PROVIDER_SITE_OTHER): Payer: PPO | Admitting: Ophthalmology

## 2020-12-31 DIAGNOSIS — L245 Irritant contact dermatitis due to other chemical products: Secondary | ICD-10-CM | POA: Diagnosis not present

## 2020-12-31 DIAGNOSIS — I788 Other diseases of capillaries: Secondary | ICD-10-CM | POA: Diagnosis not present

## 2020-12-31 DIAGNOSIS — L821 Other seborrheic keratosis: Secondary | ICD-10-CM | POA: Diagnosis not present

## 2020-12-31 DIAGNOSIS — L218 Other seborrheic dermatitis: Secondary | ICD-10-CM | POA: Diagnosis not present

## 2020-12-31 DIAGNOSIS — L820 Inflamed seborrheic keratosis: Secondary | ICD-10-CM | POA: Diagnosis not present

## 2020-12-31 DIAGNOSIS — L814 Other melanin hyperpigmentation: Secondary | ICD-10-CM | POA: Diagnosis not present

## 2020-12-31 DIAGNOSIS — Z85828 Personal history of other malignant neoplasm of skin: Secondary | ICD-10-CM | POA: Diagnosis not present

## 2020-12-31 DIAGNOSIS — D1801 Hemangioma of skin and subcutaneous tissue: Secondary | ICD-10-CM | POA: Diagnosis not present

## 2021-01-07 ENCOUNTER — Encounter (INDEPENDENT_AMBULATORY_CARE_PROVIDER_SITE_OTHER): Payer: PPO | Admitting: Ophthalmology

## 2021-01-07 ENCOUNTER — Other Ambulatory Visit: Payer: Self-pay

## 2021-01-07 DIAGNOSIS — H35031 Hypertensive retinopathy, right eye: Secondary | ICD-10-CM

## 2021-01-07 DIAGNOSIS — H34831 Tributary (branch) retinal vein occlusion, right eye, with macular edema: Secondary | ICD-10-CM | POA: Diagnosis not present

## 2021-01-07 DIAGNOSIS — H43813 Vitreous degeneration, bilateral: Secondary | ICD-10-CM

## 2021-01-07 DIAGNOSIS — I10 Essential (primary) hypertension: Secondary | ICD-10-CM | POA: Diagnosis not present

## 2021-01-30 DIAGNOSIS — H2513 Age-related nuclear cataract, bilateral: Secondary | ICD-10-CM | POA: Diagnosis not present

## 2021-01-30 DIAGNOSIS — H348312 Tributary (branch) retinal vein occlusion, right eye, stable: Secondary | ICD-10-CM | POA: Diagnosis not present

## 2021-01-30 DIAGNOSIS — H04123 Dry eye syndrome of bilateral lacrimal glands: Secondary | ICD-10-CM | POA: Diagnosis not present

## 2021-01-30 DIAGNOSIS — H5203 Hypermetropia, bilateral: Secondary | ICD-10-CM | POA: Diagnosis not present

## 2021-02-27 DIAGNOSIS — E7800 Pure hypercholesterolemia, unspecified: Secondary | ICD-10-CM | POA: Diagnosis not present

## 2021-02-27 DIAGNOSIS — M3500 Sicca syndrome, unspecified: Secondary | ICD-10-CM | POA: Diagnosis not present

## 2021-02-27 DIAGNOSIS — Z0000 Encounter for general adult medical examination without abnormal findings: Secondary | ICD-10-CM | POA: Diagnosis not present

## 2021-03-04 ENCOUNTER — Encounter (INDEPENDENT_AMBULATORY_CARE_PROVIDER_SITE_OTHER): Payer: PPO | Admitting: Ophthalmology

## 2021-03-04 ENCOUNTER — Other Ambulatory Visit: Payer: Self-pay

## 2021-03-04 DIAGNOSIS — H43813 Vitreous degeneration, bilateral: Secondary | ICD-10-CM

## 2021-03-04 DIAGNOSIS — H34831 Tributary (branch) retinal vein occlusion, right eye, with macular edema: Secondary | ICD-10-CM

## 2021-03-05 DIAGNOSIS — E7800 Pure hypercholesterolemia, unspecified: Secondary | ICD-10-CM | POA: Diagnosis not present

## 2021-04-11 DIAGNOSIS — T485X5A Adverse effect of other anti-common-cold drugs, initial encounter: Secondary | ICD-10-CM | POA: Diagnosis not present

## 2021-04-11 DIAGNOSIS — H6063 Unspecified chronic otitis externa, bilateral: Secondary | ICD-10-CM | POA: Diagnosis not present

## 2021-04-11 DIAGNOSIS — J310 Chronic rhinitis: Secondary | ICD-10-CM | POA: Diagnosis not present

## 2021-04-11 DIAGNOSIS — H6123 Impacted cerumen, bilateral: Secondary | ICD-10-CM | POA: Diagnosis not present

## 2021-04-25 DIAGNOSIS — Z85828 Personal history of other malignant neoplasm of skin: Secondary | ICD-10-CM | POA: Diagnosis not present

## 2021-04-25 DIAGNOSIS — L820 Inflamed seborrheic keratosis: Secondary | ICD-10-CM | POA: Diagnosis not present

## 2021-04-25 DIAGNOSIS — D1801 Hemangioma of skin and subcutaneous tissue: Secondary | ICD-10-CM | POA: Diagnosis not present

## 2021-04-25 DIAGNOSIS — L738 Other specified follicular disorders: Secondary | ICD-10-CM | POA: Diagnosis not present

## 2021-05-07 DIAGNOSIS — J329 Chronic sinusitis, unspecified: Secondary | ICD-10-CM | POA: Diagnosis not present

## 2021-05-07 DIAGNOSIS — H6063 Unspecified chronic otitis externa, bilateral: Secondary | ICD-10-CM | POA: Diagnosis not present

## 2021-05-13 ENCOUNTER — Encounter (INDEPENDENT_AMBULATORY_CARE_PROVIDER_SITE_OTHER): Payer: Medicare HMO | Admitting: Ophthalmology

## 2021-05-13 ENCOUNTER — Other Ambulatory Visit: Payer: Self-pay

## 2021-05-13 DIAGNOSIS — H34831 Tributary (branch) retinal vein occlusion, right eye, with macular edema: Secondary | ICD-10-CM

## 2021-05-13 DIAGNOSIS — I10 Essential (primary) hypertension: Secondary | ICD-10-CM

## 2021-05-13 DIAGNOSIS — H43813 Vitreous degeneration, bilateral: Secondary | ICD-10-CM | POA: Diagnosis not present

## 2021-05-13 DIAGNOSIS — H35031 Hypertensive retinopathy, right eye: Secondary | ICD-10-CM | POA: Diagnosis not present

## 2021-05-15 ENCOUNTER — Encounter (INDEPENDENT_AMBULATORY_CARE_PROVIDER_SITE_OTHER): Payer: PPO | Admitting: Ophthalmology

## 2021-05-27 DIAGNOSIS — R053 Chronic cough: Secondary | ICD-10-CM | POA: Diagnosis not present

## 2021-05-27 DIAGNOSIS — J343 Hypertrophy of nasal turbinates: Secondary | ICD-10-CM | POA: Diagnosis not present

## 2021-05-27 DIAGNOSIS — J342 Deviated nasal septum: Secondary | ICD-10-CM | POA: Diagnosis not present

## 2021-05-27 DIAGNOSIS — Z8709 Personal history of other diseases of the respiratory system: Secondary | ICD-10-CM | POA: Diagnosis not present

## 2021-05-27 DIAGNOSIS — J392 Other diseases of pharynx: Secondary | ICD-10-CM | POA: Diagnosis not present

## 2021-05-27 DIAGNOSIS — Z8669 Personal history of other diseases of the nervous system and sense organs: Secondary | ICD-10-CM | POA: Diagnosis not present

## 2021-05-28 DIAGNOSIS — Z1211 Encounter for screening for malignant neoplasm of colon: Secondary | ICD-10-CM | POA: Diagnosis not present

## 2021-05-28 DIAGNOSIS — Z01419 Encounter for gynecological examination (general) (routine) without abnormal findings: Secondary | ICD-10-CM | POA: Diagnosis not present

## 2021-05-28 DIAGNOSIS — I10 Essential (primary) hypertension: Secondary | ICD-10-CM | POA: Diagnosis not present

## 2021-05-28 DIAGNOSIS — Z6823 Body mass index (BMI) 23.0-23.9, adult: Secondary | ICD-10-CM | POA: Diagnosis not present

## 2021-05-28 DIAGNOSIS — E7800 Pure hypercholesterolemia, unspecified: Secondary | ICD-10-CM | POA: Diagnosis not present

## 2021-05-28 DIAGNOSIS — K649 Unspecified hemorrhoids: Secondary | ICD-10-CM | POA: Diagnosis not present

## 2021-05-28 DIAGNOSIS — Z1231 Encounter for screening mammogram for malignant neoplasm of breast: Secondary | ICD-10-CM | POA: Diagnosis not present

## 2021-05-28 DIAGNOSIS — M810 Age-related osteoporosis without current pathological fracture: Secondary | ICD-10-CM | POA: Diagnosis not present

## 2021-07-29 ENCOUNTER — Encounter (INDEPENDENT_AMBULATORY_CARE_PROVIDER_SITE_OTHER): Payer: Medicare HMO | Admitting: Ophthalmology

## 2021-07-29 DIAGNOSIS — I10 Essential (primary) hypertension: Secondary | ICD-10-CM | POA: Diagnosis not present

## 2021-07-29 DIAGNOSIS — H34831 Tributary (branch) retinal vein occlusion, right eye, with macular edema: Secondary | ICD-10-CM | POA: Diagnosis not present

## 2021-07-29 DIAGNOSIS — H35031 Hypertensive retinopathy, right eye: Secondary | ICD-10-CM

## 2021-07-31 ENCOUNTER — Encounter (INDEPENDENT_AMBULATORY_CARE_PROVIDER_SITE_OTHER): Payer: Medicare HMO | Admitting: Ophthalmology

## 2021-10-14 ENCOUNTER — Encounter (INDEPENDENT_AMBULATORY_CARE_PROVIDER_SITE_OTHER): Payer: Medicare HMO | Admitting: Ophthalmology

## 2021-10-14 DIAGNOSIS — H43813 Vitreous degeneration, bilateral: Secondary | ICD-10-CM

## 2021-10-14 DIAGNOSIS — H34831 Tributary (branch) retinal vein occlusion, right eye, with macular edema: Secondary | ICD-10-CM | POA: Diagnosis not present

## 2021-11-20 DIAGNOSIS — Z6822 Body mass index (BMI) 22.0-22.9, adult: Secondary | ICD-10-CM | POA: Diagnosis not present

## 2021-11-20 DIAGNOSIS — M3500 Sicca syndrome, unspecified: Secondary | ICD-10-CM | POA: Diagnosis not present

## 2021-11-20 DIAGNOSIS — M1991 Primary osteoarthritis, unspecified site: Secondary | ICD-10-CM | POA: Diagnosis not present

## 2021-12-25 DIAGNOSIS — H6063 Unspecified chronic otitis externa, bilateral: Secondary | ICD-10-CM | POA: Diagnosis not present

## 2021-12-25 DIAGNOSIS — L299 Pruritus, unspecified: Secondary | ICD-10-CM | POA: Diagnosis not present

## 2021-12-30 ENCOUNTER — Encounter (INDEPENDENT_AMBULATORY_CARE_PROVIDER_SITE_OTHER): Payer: Medicare HMO | Admitting: Ophthalmology

## 2022-01-01 ENCOUNTER — Encounter (INDEPENDENT_AMBULATORY_CARE_PROVIDER_SITE_OTHER): Payer: Medicare HMO | Admitting: Ophthalmology

## 2022-01-01 DIAGNOSIS — H43813 Vitreous degeneration, bilateral: Secondary | ICD-10-CM | POA: Diagnosis not present

## 2022-01-01 DIAGNOSIS — H34831 Tributary (branch) retinal vein occlusion, right eye, with macular edema: Secondary | ICD-10-CM

## 2022-01-07 DIAGNOSIS — D1801 Hemangioma of skin and subcutaneous tissue: Secondary | ICD-10-CM | POA: Diagnosis not present

## 2022-01-07 DIAGNOSIS — L853 Xerosis cutis: Secondary | ICD-10-CM | POA: Diagnosis not present

## 2022-01-07 DIAGNOSIS — L812 Freckles: Secondary | ICD-10-CM | POA: Diagnosis not present

## 2022-01-07 DIAGNOSIS — L821 Other seborrheic keratosis: Secondary | ICD-10-CM | POA: Diagnosis not present

## 2022-01-07 DIAGNOSIS — Z85828 Personal history of other malignant neoplasm of skin: Secondary | ICD-10-CM | POA: Diagnosis not present

## 2022-01-07 DIAGNOSIS — D2262 Melanocytic nevi of left upper limb, including shoulder: Secondary | ICD-10-CM | POA: Diagnosis not present

## 2022-01-07 DIAGNOSIS — L820 Inflamed seborrheic keratosis: Secondary | ICD-10-CM | POA: Diagnosis not present

## 2022-01-07 DIAGNOSIS — L84 Corns and callosities: Secondary | ICD-10-CM | POA: Diagnosis not present

## 2022-02-03 DIAGNOSIS — H2513 Age-related nuclear cataract, bilateral: Secondary | ICD-10-CM | POA: Diagnosis not present

## 2022-02-03 DIAGNOSIS — H348312 Tributary (branch) retinal vein occlusion, right eye, stable: Secondary | ICD-10-CM | POA: Diagnosis not present

## 2022-02-03 DIAGNOSIS — Z0100 Encounter for examination of eyes and vision without abnormal findings: Secondary | ICD-10-CM | POA: Diagnosis not present

## 2022-02-03 DIAGNOSIS — H04123 Dry eye syndrome of bilateral lacrimal glands: Secondary | ICD-10-CM | POA: Diagnosis not present

## 2022-02-22 DIAGNOSIS — R300 Dysuria: Secondary | ICD-10-CM | POA: Diagnosis not present

## 2022-02-22 DIAGNOSIS — N3001 Acute cystitis with hematuria: Secondary | ICD-10-CM | POA: Diagnosis not present

## 2022-03-07 DIAGNOSIS — R300 Dysuria: Secondary | ICD-10-CM | POA: Diagnosis not present

## 2022-03-07 DIAGNOSIS — Z79899 Other long term (current) drug therapy: Secondary | ICD-10-CM | POA: Diagnosis not present

## 2022-03-07 DIAGNOSIS — M3500 Sicca syndrome, unspecified: Secondary | ICD-10-CM | POA: Diagnosis not present

## 2022-03-07 DIAGNOSIS — E785 Hyperlipidemia, unspecified: Secondary | ICD-10-CM | POA: Diagnosis not present

## 2022-03-07 DIAGNOSIS — Z0000 Encounter for general adult medical examination without abnormal findings: Secondary | ICD-10-CM | POA: Diagnosis not present

## 2022-03-07 DIAGNOSIS — R030 Elevated blood-pressure reading, without diagnosis of hypertension: Secondary | ICD-10-CM | POA: Diagnosis not present

## 2022-03-07 DIAGNOSIS — Z6822 Body mass index (BMI) 22.0-22.9, adult: Secondary | ICD-10-CM | POA: Diagnosis not present

## 2022-03-11 DIAGNOSIS — Z79899 Other long term (current) drug therapy: Secondary | ICD-10-CM | POA: Diagnosis not present

## 2022-03-11 DIAGNOSIS — E785 Hyperlipidemia, unspecified: Secondary | ICD-10-CM | POA: Diagnosis not present

## 2022-03-11 DIAGNOSIS — M3500 Sicca syndrome, unspecified: Secondary | ICD-10-CM | POA: Diagnosis not present

## 2022-03-19 ENCOUNTER — Encounter (INDEPENDENT_AMBULATORY_CARE_PROVIDER_SITE_OTHER): Payer: Medicare HMO | Admitting: Ophthalmology

## 2022-03-19 DIAGNOSIS — H43813 Vitreous degeneration, bilateral: Secondary | ICD-10-CM | POA: Diagnosis not present

## 2022-03-19 DIAGNOSIS — H34831 Tributary (branch) retinal vein occlusion, right eye, with macular edema: Secondary | ICD-10-CM

## 2022-04-22 ENCOUNTER — Other Ambulatory Visit: Payer: Self-pay | Admitting: Obstetrics and Gynecology

## 2022-04-22 DIAGNOSIS — M8580 Other specified disorders of bone density and structure, unspecified site: Secondary | ICD-10-CM

## 2022-05-01 DIAGNOSIS — U071 COVID-19: Secondary | ICD-10-CM | POA: Diagnosis not present

## 2022-06-03 DIAGNOSIS — E7800 Pure hypercholesterolemia, unspecified: Secondary | ICD-10-CM | POA: Diagnosis not present

## 2022-06-03 DIAGNOSIS — Z124 Encounter for screening for malignant neoplasm of cervix: Secondary | ICD-10-CM | POA: Diagnosis not present

## 2022-06-03 DIAGNOSIS — R682 Dry mouth, unspecified: Secondary | ICD-10-CM | POA: Diagnosis not present

## 2022-06-03 DIAGNOSIS — M8580 Other specified disorders of bone density and structure, unspecified site: Secondary | ICD-10-CM | POA: Diagnosis not present

## 2022-06-03 DIAGNOSIS — Z01419 Encounter for gynecological examination (general) (routine) without abnormal findings: Secondary | ICD-10-CM | POA: Diagnosis not present

## 2022-06-03 DIAGNOSIS — Z1231 Encounter for screening mammogram for malignant neoplasm of breast: Secondary | ICD-10-CM | POA: Diagnosis not present

## 2022-06-03 DIAGNOSIS — I10 Essential (primary) hypertension: Secondary | ICD-10-CM | POA: Diagnosis not present

## 2022-06-03 DIAGNOSIS — Z6823 Body mass index (BMI) 23.0-23.9, adult: Secondary | ICD-10-CM | POA: Diagnosis not present

## 2022-06-03 DIAGNOSIS — Z1211 Encounter for screening for malignant neoplasm of colon: Secondary | ICD-10-CM | POA: Diagnosis not present

## 2022-06-06 ENCOUNTER — Other Ambulatory Visit: Payer: PPO

## 2022-06-18 DIAGNOSIS — Z79899 Other long term (current) drug therapy: Secondary | ICD-10-CM | POA: Diagnosis not present

## 2022-06-18 DIAGNOSIS — I10 Essential (primary) hypertension: Secondary | ICD-10-CM | POA: Diagnosis not present

## 2022-06-18 DIAGNOSIS — Z6821 Body mass index (BMI) 21.0-21.9, adult: Secondary | ICD-10-CM | POA: Diagnosis not present

## 2022-06-18 DIAGNOSIS — K117 Disturbances of salivary secretion: Secondary | ICD-10-CM | POA: Diagnosis not present

## 2022-06-18 DIAGNOSIS — M3500 Sicca syndrome, unspecified: Secondary | ICD-10-CM | POA: Diagnosis not present

## 2022-07-02 ENCOUNTER — Encounter (INDEPENDENT_AMBULATORY_CARE_PROVIDER_SITE_OTHER): Payer: Medicare HMO | Admitting: Ophthalmology

## 2022-07-02 DIAGNOSIS — I10 Essential (primary) hypertension: Secondary | ICD-10-CM | POA: Diagnosis not present

## 2022-07-02 DIAGNOSIS — H43813 Vitreous degeneration, bilateral: Secondary | ICD-10-CM

## 2022-07-02 DIAGNOSIS — H34831 Tributary (branch) retinal vein occlusion, right eye, with macular edema: Secondary | ICD-10-CM | POA: Diagnosis not present

## 2022-07-02 DIAGNOSIS — H35033 Hypertensive retinopathy, bilateral: Secondary | ICD-10-CM

## 2022-09-10 ENCOUNTER — Encounter (INDEPENDENT_AMBULATORY_CARE_PROVIDER_SITE_OTHER): Payer: Medicare HMO | Admitting: Ophthalmology

## 2022-09-10 DIAGNOSIS — I10 Essential (primary) hypertension: Secondary | ICD-10-CM | POA: Diagnosis not present

## 2022-09-10 DIAGNOSIS — H2513 Age-related nuclear cataract, bilateral: Secondary | ICD-10-CM | POA: Diagnosis not present

## 2022-09-10 DIAGNOSIS — H34831 Tributary (branch) retinal vein occlusion, right eye, with macular edema: Secondary | ICD-10-CM | POA: Diagnosis not present

## 2022-09-10 DIAGNOSIS — H43813 Vitreous degeneration, bilateral: Secondary | ICD-10-CM

## 2022-09-10 DIAGNOSIS — H35033 Hypertensive retinopathy, bilateral: Secondary | ICD-10-CM

## 2022-09-17 DIAGNOSIS — M1991 Primary osteoarthritis, unspecified site: Secondary | ICD-10-CM | POA: Diagnosis not present

## 2022-09-17 DIAGNOSIS — M3501 Sicca syndrome with keratoconjunctivitis: Secondary | ICD-10-CM | POA: Diagnosis not present

## 2022-09-17 DIAGNOSIS — Z6821 Body mass index (BMI) 21.0-21.9, adult: Secondary | ICD-10-CM | POA: Diagnosis not present

## 2022-11-11 ENCOUNTER — Inpatient Hospital Stay: Admission: RE | Admit: 2022-11-11 | Payer: PPO | Source: Ambulatory Visit

## 2022-11-11 DIAGNOSIS — M8580 Other specified disorders of bone density and structure, unspecified site: Secondary | ICD-10-CM

## 2022-11-11 DIAGNOSIS — N958 Other specified menopausal and perimenopausal disorders: Secondary | ICD-10-CM | POA: Diagnosis not present

## 2022-11-24 DIAGNOSIS — L299 Pruritus, unspecified: Secondary | ICD-10-CM | POA: Diagnosis not present

## 2022-12-03 ENCOUNTER — Encounter (INDEPENDENT_AMBULATORY_CARE_PROVIDER_SITE_OTHER): Payer: Medicare HMO | Admitting: Ophthalmology

## 2022-12-03 DIAGNOSIS — H34831 Tributary (branch) retinal vein occlusion, right eye, with macular edema: Secondary | ICD-10-CM

## 2022-12-03 DIAGNOSIS — H35033 Hypertensive retinopathy, bilateral: Secondary | ICD-10-CM | POA: Diagnosis not present

## 2022-12-03 DIAGNOSIS — I10 Essential (primary) hypertension: Secondary | ICD-10-CM | POA: Diagnosis not present

## 2022-12-03 DIAGNOSIS — H43813 Vitreous degeneration, bilateral: Secondary | ICD-10-CM

## 2023-02-04 DIAGNOSIS — D2271 Melanocytic nevi of right lower limb, including hip: Secondary | ICD-10-CM | POA: Diagnosis not present

## 2023-02-04 DIAGNOSIS — L821 Other seborrheic keratosis: Secondary | ICD-10-CM | POA: Diagnosis not present

## 2023-02-04 DIAGNOSIS — D1801 Hemangioma of skin and subcutaneous tissue: Secondary | ICD-10-CM | POA: Diagnosis not present

## 2023-02-04 DIAGNOSIS — Z85828 Personal history of other malignant neoplasm of skin: Secondary | ICD-10-CM | POA: Diagnosis not present

## 2023-02-04 DIAGNOSIS — L812 Freckles: Secondary | ICD-10-CM | POA: Diagnosis not present

## 2023-02-25 ENCOUNTER — Encounter (INDEPENDENT_AMBULATORY_CARE_PROVIDER_SITE_OTHER): Payer: Medicare HMO | Admitting: Ophthalmology

## 2023-02-25 DIAGNOSIS — H43813 Vitreous degeneration, bilateral: Secondary | ICD-10-CM

## 2023-02-25 DIAGNOSIS — H35033 Hypertensive retinopathy, bilateral: Secondary | ICD-10-CM

## 2023-02-25 DIAGNOSIS — I10 Essential (primary) hypertension: Secondary | ICD-10-CM | POA: Diagnosis not present

## 2023-02-25 DIAGNOSIS — H34831 Tributary (branch) retinal vein occlusion, right eye, with macular edema: Secondary | ICD-10-CM

## 2023-03-03 DIAGNOSIS — Z1321 Encounter for screening for nutritional disorder: Secondary | ICD-10-CM | POA: Diagnosis not present

## 2023-03-03 DIAGNOSIS — E785 Hyperlipidemia, unspecified: Secondary | ICD-10-CM | POA: Diagnosis not present

## 2023-03-03 DIAGNOSIS — Z130 Encounter for screening for diseases of the blood and blood-forming organs and certain disorders involving the immune mechanism: Secondary | ICD-10-CM | POA: Diagnosis not present

## 2023-03-03 DIAGNOSIS — Z13228 Encounter for screening for other metabolic disorders: Secondary | ICD-10-CM | POA: Diagnosis not present

## 2023-03-10 DIAGNOSIS — M8580 Other specified disorders of bone density and structure, unspecified site: Secondary | ICD-10-CM | POA: Diagnosis not present

## 2023-03-10 DIAGNOSIS — R030 Elevated blood-pressure reading, without diagnosis of hypertension: Secondary | ICD-10-CM | POA: Diagnosis not present

## 2023-03-10 DIAGNOSIS — Z0000 Encounter for general adult medical examination without abnormal findings: Secondary | ICD-10-CM | POA: Diagnosis not present

## 2023-03-10 DIAGNOSIS — E7800 Pure hypercholesterolemia, unspecified: Secondary | ICD-10-CM | POA: Diagnosis not present

## 2023-03-11 ENCOUNTER — Other Ambulatory Visit (HOSPITAL_COMMUNITY): Payer: Self-pay | Admitting: Family Medicine

## 2023-03-11 DIAGNOSIS — E7800 Pure hypercholesterolemia, unspecified: Secondary | ICD-10-CM

## 2023-05-06 ENCOUNTER — Encounter (INDEPENDENT_AMBULATORY_CARE_PROVIDER_SITE_OTHER): Payer: Medicare HMO | Admitting: Ophthalmology

## 2023-05-06 DIAGNOSIS — H35033 Hypertensive retinopathy, bilateral: Secondary | ICD-10-CM | POA: Diagnosis not present

## 2023-05-06 DIAGNOSIS — H43813 Vitreous degeneration, bilateral: Secondary | ICD-10-CM | POA: Diagnosis not present

## 2023-05-06 DIAGNOSIS — H348312 Tributary (branch) retinal vein occlusion, right eye, stable: Secondary | ICD-10-CM | POA: Diagnosis not present

## 2023-05-06 DIAGNOSIS — I10 Essential (primary) hypertension: Secondary | ICD-10-CM

## 2023-05-06 DIAGNOSIS — H2513 Age-related nuclear cataract, bilateral: Secondary | ICD-10-CM

## 2023-06-08 DIAGNOSIS — H348312 Tributary (branch) retinal vein occlusion, right eye, stable: Secondary | ICD-10-CM | POA: Diagnosis not present

## 2023-06-08 DIAGNOSIS — H43811 Vitreous degeneration, right eye: Secondary | ICD-10-CM | POA: Diagnosis not present

## 2023-06-08 DIAGNOSIS — H2511 Age-related nuclear cataract, right eye: Secondary | ICD-10-CM | POA: Diagnosis not present

## 2023-06-09 ENCOUNTER — Encounter: Payer: Self-pay | Admitting: Obstetrics and Gynecology

## 2023-06-09 DIAGNOSIS — Z1231 Encounter for screening mammogram for malignant neoplasm of breast: Secondary | ICD-10-CM

## 2023-06-18 DIAGNOSIS — H2511 Age-related nuclear cataract, right eye: Secondary | ICD-10-CM | POA: Diagnosis not present

## 2023-06-30 ENCOUNTER — Encounter (INDEPENDENT_AMBULATORY_CARE_PROVIDER_SITE_OTHER): Admitting: Ophthalmology

## 2023-06-30 DIAGNOSIS — H35033 Hypertensive retinopathy, bilateral: Secondary | ICD-10-CM | POA: Diagnosis not present

## 2023-06-30 DIAGNOSIS — H59031 Cystoid macular edema following cataract surgery, right eye: Secondary | ICD-10-CM

## 2023-06-30 DIAGNOSIS — H43813 Vitreous degeneration, bilateral: Secondary | ICD-10-CM | POA: Diagnosis not present

## 2023-06-30 DIAGNOSIS — H2512 Age-related nuclear cataract, left eye: Secondary | ICD-10-CM

## 2023-06-30 DIAGNOSIS — H34831 Tributary (branch) retinal vein occlusion, right eye, with macular edema: Secondary | ICD-10-CM | POA: Diagnosis not present

## 2023-06-30 DIAGNOSIS — I10 Essential (primary) hypertension: Secondary | ICD-10-CM

## 2023-07-08 ENCOUNTER — Encounter (INDEPENDENT_AMBULATORY_CARE_PROVIDER_SITE_OTHER): Payer: Medicare HMO | Admitting: Ophthalmology

## 2023-07-20 DIAGNOSIS — D485 Neoplasm of uncertain behavior of skin: Secondary | ICD-10-CM | POA: Diagnosis not present

## 2023-07-20 DIAGNOSIS — Z85828 Personal history of other malignant neoplasm of skin: Secondary | ICD-10-CM | POA: Diagnosis not present

## 2023-07-20 DIAGNOSIS — L821 Other seborrheic keratosis: Secondary | ICD-10-CM | POA: Diagnosis not present

## 2023-07-20 DIAGNOSIS — L820 Inflamed seborrheic keratosis: Secondary | ICD-10-CM | POA: Diagnosis not present

## 2023-07-29 ENCOUNTER — Encounter (INDEPENDENT_AMBULATORY_CARE_PROVIDER_SITE_OTHER): Admitting: Ophthalmology

## 2023-07-29 DIAGNOSIS — H59031 Cystoid macular edema following cataract surgery, right eye: Secondary | ICD-10-CM

## 2023-07-29 DIAGNOSIS — H43813 Vitreous degeneration, bilateral: Secondary | ICD-10-CM | POA: Diagnosis not present

## 2023-07-29 DIAGNOSIS — H35033 Hypertensive retinopathy, bilateral: Secondary | ICD-10-CM | POA: Diagnosis not present

## 2023-07-29 DIAGNOSIS — H34831 Tributary (branch) retinal vein occlusion, right eye, with macular edema: Secondary | ICD-10-CM

## 2023-07-29 DIAGNOSIS — I10 Essential (primary) hypertension: Secondary | ICD-10-CM

## 2023-08-09 DIAGNOSIS — H2512 Age-related nuclear cataract, left eye: Secondary | ICD-10-CM | POA: Diagnosis not present

## 2023-08-13 DIAGNOSIS — H2512 Age-related nuclear cataract, left eye: Secondary | ICD-10-CM | POA: Diagnosis not present

## 2023-09-02 ENCOUNTER — Encounter (INDEPENDENT_AMBULATORY_CARE_PROVIDER_SITE_OTHER): Admitting: Ophthalmology

## 2023-09-02 DIAGNOSIS — I10 Essential (primary) hypertension: Secondary | ICD-10-CM

## 2023-09-02 DIAGNOSIS — H34831 Tributary (branch) retinal vein occlusion, right eye, with macular edema: Secondary | ICD-10-CM | POA: Diagnosis not present

## 2023-09-02 DIAGNOSIS — H35033 Hypertensive retinopathy, bilateral: Secondary | ICD-10-CM

## 2023-09-02 DIAGNOSIS — H43813 Vitreous degeneration, bilateral: Secondary | ICD-10-CM | POA: Diagnosis not present

## 2023-09-02 DIAGNOSIS — H353122 Nonexudative age-related macular degeneration, left eye, intermediate dry stage: Secondary | ICD-10-CM

## 2023-09-10 ENCOUNTER — Other Ambulatory Visit (HOSPITAL_COMMUNITY): Payer: Self-pay | Admitting: Family Medicine

## 2023-09-10 DIAGNOSIS — E7800 Pure hypercholesterolemia, unspecified: Secondary | ICD-10-CM

## 2023-09-14 DIAGNOSIS — Z0100 Encounter for examination of eyes and vision without abnormal findings: Secondary | ICD-10-CM | POA: Diagnosis not present

## 2023-09-16 DIAGNOSIS — M3501 Sicca syndrome with keratoconjunctivitis: Secondary | ICD-10-CM | POA: Diagnosis not present

## 2023-09-16 DIAGNOSIS — Z6820 Body mass index (BMI) 20.0-20.9, adult: Secondary | ICD-10-CM | POA: Diagnosis not present

## 2023-09-16 DIAGNOSIS — M1991 Primary osteoarthritis, unspecified site: Secondary | ICD-10-CM | POA: Diagnosis not present

## 2023-10-05 ENCOUNTER — Ambulatory Visit (HOSPITAL_BASED_OUTPATIENT_CLINIC_OR_DEPARTMENT_OTHER)
Admission: RE | Admit: 2023-10-05 | Discharge: 2023-10-05 | Disposition: A | Discharge status: Home / Self Care | Payer: Self-pay | Source: Ambulatory Visit | Attending: Family Medicine | Admitting: Family Medicine

## 2023-10-05 DIAGNOSIS — E7800 Pure hypercholesterolemia, unspecified: Secondary | ICD-10-CM | POA: Insufficient documentation

## 2023-10-22 ENCOUNTER — Encounter (INDEPENDENT_AMBULATORY_CARE_PROVIDER_SITE_OTHER): Admitting: Ophthalmology

## 2023-10-22 DIAGNOSIS — I10 Essential (primary) hypertension: Secondary | ICD-10-CM | POA: Diagnosis not present

## 2023-10-22 DIAGNOSIS — H34831 Tributary (branch) retinal vein occlusion, right eye, with macular edema: Secondary | ICD-10-CM | POA: Diagnosis not present

## 2023-10-22 DIAGNOSIS — H43813 Vitreous degeneration, bilateral: Secondary | ICD-10-CM

## 2023-10-22 DIAGNOSIS — H35033 Hypertensive retinopathy, bilateral: Secondary | ICD-10-CM

## 2023-10-23 ENCOUNTER — Encounter (INDEPENDENT_AMBULATORY_CARE_PROVIDER_SITE_OTHER): Admitting: Ophthalmology

## 2023-12-08 ENCOUNTER — Encounter (INDEPENDENT_AMBULATORY_CARE_PROVIDER_SITE_OTHER): Admitting: Ophthalmology

## 2023-12-08 DIAGNOSIS — H348312 Tributary (branch) retinal vein occlusion, right eye, stable: Secondary | ICD-10-CM | POA: Diagnosis not present

## 2023-12-08 DIAGNOSIS — I10 Essential (primary) hypertension: Secondary | ICD-10-CM

## 2023-12-08 DIAGNOSIS — H43813 Vitreous degeneration, bilateral: Secondary | ICD-10-CM | POA: Diagnosis not present

## 2023-12-08 DIAGNOSIS — H35033 Hypertensive retinopathy, bilateral: Secondary | ICD-10-CM | POA: Diagnosis not present

## 2024-01-12 ENCOUNTER — Encounter (INDEPENDENT_AMBULATORY_CARE_PROVIDER_SITE_OTHER): Admitting: Ophthalmology

## 2024-01-12 DIAGNOSIS — H348311 Tributary (branch) retinal vein occlusion, right eye, with retinal neovascularization: Secondary | ICD-10-CM

## 2024-01-12 DIAGNOSIS — I10 Essential (primary) hypertension: Secondary | ICD-10-CM | POA: Diagnosis not present

## 2024-01-12 DIAGNOSIS — H35033 Hypertensive retinopathy, bilateral: Secondary | ICD-10-CM

## 2024-01-12 DIAGNOSIS — H59031 Cystoid macular edema following cataract surgery, right eye: Secondary | ICD-10-CM

## 2024-01-12 DIAGNOSIS — H43813 Vitreous degeneration, bilateral: Secondary | ICD-10-CM

## 2024-01-15 DIAGNOSIS — M7752 Other enthesopathy of left foot: Secondary | ICD-10-CM | POA: Diagnosis not present

## 2024-02-15 DIAGNOSIS — Z85828 Personal history of other malignant neoplasm of skin: Secondary | ICD-10-CM | POA: Diagnosis not present

## 2024-02-15 DIAGNOSIS — D225 Melanocytic nevi of trunk: Secondary | ICD-10-CM | POA: Diagnosis not present

## 2024-02-15 DIAGNOSIS — D1801 Hemangioma of skin and subcutaneous tissue: Secondary | ICD-10-CM | POA: Diagnosis not present

## 2024-02-15 DIAGNOSIS — D2262 Melanocytic nevi of left upper limb, including shoulder: Secondary | ICD-10-CM | POA: Diagnosis not present

## 2024-02-15 DIAGNOSIS — L821 Other seborrheic keratosis: Secondary | ICD-10-CM | POA: Diagnosis not present

## 2024-02-15 DIAGNOSIS — D2261 Melanocytic nevi of right upper limb, including shoulder: Secondary | ICD-10-CM | POA: Diagnosis not present

## 2024-02-15 DIAGNOSIS — L812 Freckles: Secondary | ICD-10-CM | POA: Diagnosis not present

## 2024-02-18 ENCOUNTER — Encounter (INDEPENDENT_AMBULATORY_CARE_PROVIDER_SITE_OTHER): Admitting: Ophthalmology

## 2024-02-18 DIAGNOSIS — H34831 Tributary (branch) retinal vein occlusion, right eye, with macular edema: Secondary | ICD-10-CM

## 2024-02-18 DIAGNOSIS — H35033 Hypertensive retinopathy, bilateral: Secondary | ICD-10-CM

## 2024-02-18 DIAGNOSIS — I10 Essential (primary) hypertension: Secondary | ICD-10-CM | POA: Diagnosis not present

## 2024-02-18 DIAGNOSIS — H43813 Vitreous degeneration, bilateral: Secondary | ICD-10-CM | POA: Diagnosis not present

## 2024-02-29 DIAGNOSIS — Z1321 Encounter for screening for nutritional disorder: Secondary | ICD-10-CM | POA: Diagnosis not present

## 2024-02-29 DIAGNOSIS — E785 Hyperlipidemia, unspecified: Secondary | ICD-10-CM | POA: Diagnosis not present

## 2024-02-29 DIAGNOSIS — M3500 Sicca syndrome, unspecified: Secondary | ICD-10-CM | POA: Diagnosis not present

## 2024-03-09 DIAGNOSIS — Z0000 Encounter for general adult medical examination without abnormal findings: Secondary | ICD-10-CM | POA: Diagnosis not present

## 2024-03-09 DIAGNOSIS — Z1331 Encounter for screening for depression: Secondary | ICD-10-CM | POA: Diagnosis not present

## 2024-03-09 DIAGNOSIS — B370 Candidal stomatitis: Secondary | ICD-10-CM | POA: Diagnosis not present

## 2024-03-15 DIAGNOSIS — H43811 Vitreous degeneration, right eye: Secondary | ICD-10-CM | POA: Diagnosis not present

## 2024-03-15 DIAGNOSIS — Z961 Presence of intraocular lens: Secondary | ICD-10-CM | POA: Diagnosis not present

## 2024-03-15 DIAGNOSIS — H348312 Tributary (branch) retinal vein occlusion, right eye, stable: Secondary | ICD-10-CM | POA: Diagnosis not present

## 2024-04-08 ENCOUNTER — Encounter (INDEPENDENT_AMBULATORY_CARE_PROVIDER_SITE_OTHER): Admitting: Ophthalmology

## 2024-04-14 ENCOUNTER — Encounter (INDEPENDENT_AMBULATORY_CARE_PROVIDER_SITE_OTHER): Admitting: Ophthalmology

## 2024-04-14 DIAGNOSIS — H353122 Nonexudative age-related macular degeneration, left eye, intermediate dry stage: Secondary | ICD-10-CM | POA: Diagnosis not present

## 2024-04-14 DIAGNOSIS — H43813 Vitreous degeneration, bilateral: Secondary | ICD-10-CM | POA: Diagnosis not present

## 2024-04-14 DIAGNOSIS — H59031 Cystoid macular edema following cataract surgery, right eye: Secondary | ICD-10-CM | POA: Diagnosis not present

## 2024-04-14 DIAGNOSIS — H35033 Hypertensive retinopathy, bilateral: Secondary | ICD-10-CM

## 2024-04-14 DIAGNOSIS — H34831 Tributary (branch) retinal vein occlusion, right eye, with macular edema: Secondary | ICD-10-CM | POA: Diagnosis not present

## 2024-04-14 DIAGNOSIS — I10 Essential (primary) hypertension: Secondary | ICD-10-CM

## 2024-06-07 ENCOUNTER — Encounter (INDEPENDENT_AMBULATORY_CARE_PROVIDER_SITE_OTHER): Admitting: Ophthalmology
